# Patient Record
Sex: Female | Born: 1995 | Race: Black or African American | Hispanic: No | Marital: Single | Smoking: Former smoker
Health system: Southern US, Community
[De-identification: ages and names within clinical notes are randomized; demographics above are authoritative.]

## PROBLEM LIST (undated history)

## (undated) ENCOUNTER — Inpatient Hospital Stay (HOSPITAL_COMMUNITY): Payer: Self-pay

## (undated) DIAGNOSIS — F419 Anxiety disorder, unspecified: Secondary | ICD-10-CM

## (undated) DIAGNOSIS — I10 Essential (primary) hypertension: Secondary | ICD-10-CM

## (undated) DIAGNOSIS — D649 Anemia, unspecified: Secondary | ICD-10-CM

## (undated) HISTORY — PX: TONSILLECTOMY: SUR1361

## (undated) HISTORY — DX: Essential (primary) hypertension: I10

## (undated) HISTORY — PX: WISDOM TOOTH EXTRACTION: SHX21

## (undated) HISTORY — DX: Anemia, unspecified: D64.9

## (undated) HISTORY — DX: Anxiety disorder, unspecified: F41.9

---

## 2017-02-19 DIAGNOSIS — O09899 Supervision of other high risk pregnancies, unspecified trimester: Secondary | ICD-10-CM | POA: Insufficient documentation

## 2017-02-19 HISTORY — DX: Supervision of other high risk pregnancies, unspecified trimester: O09.899

## 2017-04-18 DIAGNOSIS — O139 Gestational [pregnancy-induced] hypertension without significant proteinuria, unspecified trimester: Secondary | ICD-10-CM | POA: Insufficient documentation

## 2017-05-24 DIAGNOSIS — O139 Gestational [pregnancy-induced] hypertension without significant proteinuria, unspecified trimester: Secondary | ICD-10-CM

## 2018-03-21 ENCOUNTER — Ambulatory Visit (INDEPENDENT_AMBULATORY_CARE_PROVIDER_SITE_OTHER): Payer: Medicaid Other | Admitting: *Deleted

## 2018-03-21 ENCOUNTER — Other Ambulatory Visit (HOSPITAL_COMMUNITY)
Admission: RE | Admit: 2018-03-21 | Discharge: 2018-03-21 | Disposition: A | Discharge status: Home / Self Care | Payer: Medicaid Other | Source: Ambulatory Visit | Attending: Family Medicine | Admitting: Family Medicine

## 2018-03-21 VITALS — BP 125/79 | HR 102 | Ht 65.0 in | Wt 295.9 lb

## 2018-03-21 DIAGNOSIS — O0991 Supervision of high risk pregnancy, unspecified, first trimester: Secondary | ICD-10-CM | POA: Diagnosis not present

## 2018-03-21 DIAGNOSIS — Z8759 Personal history of other complications of pregnancy, childbirth and the puerperium: Secondary | ICD-10-CM | POA: Insufficient documentation

## 2018-03-21 DIAGNOSIS — Z98891 History of uterine scar from previous surgery: Secondary | ICD-10-CM

## 2018-03-21 DIAGNOSIS — O0990 Supervision of high risk pregnancy, unspecified, unspecified trimester: Secondary | ICD-10-CM | POA: Diagnosis not present

## 2018-03-21 HISTORY — DX: Supervision of high risk pregnancy, unspecified, unspecified trimester: O09.90

## 2018-03-21 HISTORY — DX: History of uterine scar from previous surgery: Z98.891

## 2018-03-21 HISTORY — DX: Personal history of other complications of pregnancy, childbirth and the puerperium: Z87.59

## 2018-03-21 NOTE — Progress Notes (Addendum)
New Ob intake interview completed. New Ob information packet given. Pt reports no problems. She was unable to meet w/Jamie for intro today. Pt will sign ROI today in order to obtain previous pregnancy records.

## 2018-03-23 LAB — CULTURE, OB URINE

## 2018-03-23 LAB — URINE CULTURE, OB REFLEX

## 2018-03-24 LAB — AB SCR+ANTIBODY ID: Antibody Screen: POSITIVE — AB

## 2018-03-24 LAB — OBSTETRIC PANEL, INCLUDING HIV
Basophils Absolute: 0 10*3/uL (ref 0.0–0.2)
Basos: 0 %
EOS (ABSOLUTE): 0.1 10*3/uL (ref 0.0–0.4)
Eos: 1 %
HIV Screen 4th Generation wRfx: NONREACTIVE
Hematocrit: 32.9 % — ABNORMAL LOW (ref 34.0–46.6)
Hemoglobin: 10.6 g/dL — ABNORMAL LOW (ref 11.1–15.9)
Hepatitis B Surface Ag: NEGATIVE
Immature Grans (Abs): 0 10*3/uL (ref 0.0–0.1)
Immature Granulocytes: 0 %
Lymphocytes Absolute: 2.2 10*3/uL (ref 0.7–3.1)
Lymphs: 26 %
MCH: 24.8 pg — ABNORMAL LOW (ref 26.6–33.0)
MCHC: 32.2 g/dL (ref 31.5–35.7)
MCV: 77 fL — ABNORMAL LOW (ref 79–97)
Monocytes Absolute: 0.5 10*3/uL (ref 0.1–0.9)
Monocytes: 6 %
Neutrophils Absolute: 5.8 10*3/uL (ref 1.4–7.0)
Neutrophils: 67 %
Platelets: 304 10*3/uL (ref 150–450)
RBC: 4.27 x10E6/uL (ref 3.77–5.28)
RDW: 17.4 % — ABNORMAL HIGH (ref 12.3–15.4)
RPR Ser Ql: NONREACTIVE
Rh Factor: POSITIVE
Rubella Antibodies, IGG: 5.09 {index}
WBC: 8.7 10*3/uL (ref 3.4–10.8)

## 2018-03-24 LAB — GC/CHLAMYDIA PROBE AMP (~~LOC~~) NOT AT ARMC
Chlamydia: NEGATIVE
Neisseria Gonorrhea: NEGATIVE

## 2018-03-27 ENCOUNTER — Encounter: Payer: Self-pay | Admitting: Obstetrics and Gynecology

## 2018-03-27 DIAGNOSIS — O361990 Maternal care for other isoimmunization, unspecified trimester, not applicable or unspecified: Secondary | ICD-10-CM

## 2018-03-27 HISTORY — DX: Maternal care for other isoimmunization, unspecified trimester, not applicable or unspecified: O36.1990

## 2018-03-31 ENCOUNTER — Encounter: Payer: Self-pay | Admitting: *Deleted

## 2018-03-31 ENCOUNTER — Encounter (HOSPITAL_COMMUNITY): Payer: Self-pay

## 2018-04-03 ENCOUNTER — Ambulatory Visit: Payer: Medicaid Other | Admitting: Clinical

## 2018-04-03 ENCOUNTER — Ambulatory Visit (INDEPENDENT_AMBULATORY_CARE_PROVIDER_SITE_OTHER): Payer: Medicaid Other | Admitting: Obstetrics and Gynecology

## 2018-04-03 VITALS — BP 104/69 | HR 86 | Wt 294.2 lb

## 2018-04-03 DIAGNOSIS — O0990 Supervision of high risk pregnancy, unspecified, unspecified trimester: Secondary | ICD-10-CM

## 2018-04-03 DIAGNOSIS — Z98891 History of uterine scar from previous surgery: Secondary | ICD-10-CM

## 2018-04-03 DIAGNOSIS — O99212 Obesity complicating pregnancy, second trimester: Secondary | ICD-10-CM

## 2018-04-03 DIAGNOSIS — O99210 Obesity complicating pregnancy, unspecified trimester: Secondary | ICD-10-CM

## 2018-04-03 DIAGNOSIS — O0992 Supervision of high risk pregnancy, unspecified, second trimester: Secondary | ICD-10-CM

## 2018-04-03 DIAGNOSIS — Z8759 Personal history of other complications of pregnancy, childbirth and the puerperium: Secondary | ICD-10-CM

## 2018-04-03 HISTORY — DX: Obesity complicating pregnancy, unspecified trimester: O99.210

## 2018-04-03 NOTE — Progress Notes (Signed)
Subjective:   Catherine Cochran is a 23 y.o. G3P2002 at [redacted]w[redacted]d by LMP being seen today for her first obstetrical visit.  Her obstetrical history is significant for obesity and pregnancy induced hypertension with previous pregnancy, C section X 2. Patient does intend to breast feed. Pregnancy history fully reviewed. Planned pregnancy, excited about the pregnancy.   Patient reports no complaints.  HISTORY: OB History  Gravida Para Term Preterm AB Living  3 2 2  0 0 2  SAB TAB Ectopic Multiple Live Births  0 0 0 0 2    # Outcome Date GA Lbr Len/2nd Weight Sex Delivery Anes PTL Lv  3 Current           2 Term 05/24/17   5 lb 13 oz (2.637 kg) F CS-LTranv EPI  LIV     Birth Comments: 2 vessel cord     Complications: Gestational hypertension     Name: Maryelizabeth Kaufmann  1 Term 02/28/16   6 lb 10 oz (3.005 kg) M CS-LTranv EPI N LIV     Complications: Fetal Intolerance     Name: Bhavini Pirrello    Last pap smear was not done, last pap was 03/2018 and it was normal per the patient.   Past Medical History:  Diagnosis Date  . Anemia   . Hypertension    Gestational HTN 2018   Past Surgical History:  Procedure Laterality Date  . CESAREAN SECTION  2017, 2018  . TONSILLECTOMY     2nd grade  . WISDOM TOOTH EXTRACTION     Family History  Problem Relation Age of Onset  . Diabetes Mother   . HIV Mother   . Asthma Mother   . Asthma Brother    Social History   Tobacco Use  . Smoking status: Never Smoker  . Smokeless tobacco: Never Used  Substance Use Topics  . Alcohol use: Not Currently  . Drug use: Never   No Known Allergies Current Outpatient Medications on File Prior to Visit  Medication Sig Dispense Refill  . Prenatal Vit-Fe Fumarate-FA (MULTIVITAMIN-PRENATAL) 27-0.8 MG TABS tablet Take 1 tablet by mouth daily at 12 noon.     No current facility-administered medications on file prior to visit.     Review of Systems Pertinent items noted in HPI and remainder of comprehensive ROS  otherwise negative.  Exam   Vitals:   04/03/18 1509  BP: 104/69  Pulse: 86  Weight: 294 lb 3.2 oz (133.4 kg)   Fetal Heart Rate (bpm): 156  Uterus:     Pelvic Exam: Perineum: no hemorrhoids, normal perineum   Vulva: normal external genitalia, no lesions   Vagina:  normal mucosa, normal discharge   Cervix: no lesions and normal, pap smear done.    Adnexa: normal adnexa and no mass, fullness, tenderness   Bony Pelvis: average  System: General: well-developed, well-nourished female in no acute distress   Breasts:  normal appearance, no masses or tenderness bilaterally   Skin: normal coloration and turgor, no rashes   Neurologic: oriented, normal, negative, normal mood   Extremities: normal strength, tone, and muscle mass, ROM of all joints is normal   HEENT PERRLA, extraocular movement intact and sclera clear, anicteric   Mouth/Teeth mucous membranes moist, pharynx normal without lesions and dental hygiene good   Neck supple and no masses   Cardiovascular: regular rate and rhythm   Respiratory:  no respiratory distress, normal breath sounds   Abdomen: soft, non-tender; bowel sounds normal; no masses,  no organomegaly   Bedside Ultrasound for FHR check: Patient informed that the ultrasound is considered a limited obstetric ultrasound and is not intended to be a complete ultrasound exam.  Patient also informed that the ultrasound is not being completed with the intent of assessing for fetal or placental anomalies or any pelvic abnormalities.  Explained that the purpose of today's ultrasound is to assess for fetal heart rate.  Patient acknowledges the purpose of the exam and the limitations of the study.   Assessment:   Pregnancy: [id number] Patient Active Problem List   Diagnosis Date Noted  . 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 Supervision of high risk pregnancy, antepartum 03/21/2018  . History of gestational hypertension 03/21/2018  . History of 2 cesarean  sections 03/21/2018     Plan:   1. Supervision of high risk pregnancy, antepartum  - AFP/Quad Scr - Hemoglobinopathy evaluation - HgB A1c - Inheritest(R) CF/SMA Panel  2. History of gestational hypertension  Start BASA   3. History of 2 cesarean sections  Repeat desired    Initial labs drawn. Continue prenatal vitamins. Genetic Screening discussed, NIPS: results reviewed. Ultrasound discussed; fetal anatomic survey: ordered. Problem list reviewed and updated. The nature of [facility] with multiple MDs and other Advanced Practice Providers was explained to patient; also emphasized that residents, students are part of our team. Routine obstetric precautions reviewed. No follow-ups on file.    Biochemist, clinical for [employer], [facility]

## 2018-04-03 NOTE — BH Specialist Note (Signed)
Integrated Behavioral Health Initial Visit  MRN: [id number] Name: Catherine Cochran  Number of Integrated Behavioral Health Clinician visits:: 1/6 Session Start time: 3:52  Session End time: 4:04 Total time: 15 minutes  Type of Service: Integrated Behavioral Health- Individual/Family Interpretor:No. Interpretor Name and Language: n/a   Warm Hand Off Completed.       SUBJECTIVE: Catherine Cochran is a 23 y.o. female accompanied by Partner/Significant Other Patient was referred by Tinnie Gens, MD for Initial OB introduction to integrated behavioral health services . Patient reports the following symptoms/concerns: Pt has no particular concerns today. Duration of problem: n/a; Severity of problem: n/a  OBJECTIVE: Mood: Normal and Affect: Appropriate Risk of harm to self or others: No plan to harm self or others  LIFE CONTEXT: Family and Social: Pt lives with FOB and two children(3;1) School/Work: - Self-Care: - Life Changes: Current pregnancy   GOALS ADDRESSED: Patient will: 1. Increase knowledge and/or ability of: healthy habits   INTERVENTIONS: Interventions utilized: Psychoeducation and/or Health Education  Standardized Assessments completed: Not given today  ASSESSMENT: Patient currently experiencing Supervision of high risk pregnancy, antepartum.   Patient may benefit from Initial OB introduction to integrated behavioral health services .  PLAN: 1. Follow up with behavioral health clinician on : As needed 2. Behavioral recommendations:  -Continue taking prenatal vitamins, as recommended by medical provider  3. Referral(s): Integrated [employer] (In Clinic) 4. "From scale of 1-10, how likely are you to follow plan?": 10  Rae Lips, LCSW   Depression screen [facility] 2/9 03/21/2018  Decreased Interest 0  Down, Depressed, Hopeless 0  PHQ - 2 Score 0  Altered sleeping 1  Tired, decreased energy 1  Change in appetite 1  Feeling bad or failure about  yourself  0  Trouble concentrating 0  Moving slowly or fidgety/restless 0  Suicidal thoughts 0  PHQ-9 Score 3   GAD 7 : Generalized Anxiety Score 03/21/2018  Nervous, Anxious, on Edge 2  Control/stop worrying 0  Worry too much - different things 1  Trouble relaxing 1  Restless 0  Easily annoyed or irritable 1  Afraid - awful might happen 1  Total GAD 7 Score 6

## 2018-04-07 ENCOUNTER — Other Ambulatory Visit (HOSPITAL_COMMUNITY): Payer: Self-pay | Admitting: *Deleted

## 2018-04-07 ENCOUNTER — Encounter (HOSPITAL_COMMUNITY): Payer: Self-pay

## 2018-04-07 ENCOUNTER — Ambulatory Visit (HOSPITAL_COMMUNITY)
Admission: RE | Admit: 2018-04-07 | Discharge: 2018-04-07 | Disposition: A | Discharge status: Home / Self Care | Payer: Medicaid Other | Source: Ambulatory Visit | Attending: Obstetrics and Gynecology | Admitting: Obstetrics and Gynecology

## 2018-04-07 DIAGNOSIS — O99212 Obesity complicating pregnancy, second trimester: Secondary | ICD-10-CM

## 2018-04-07 DIAGNOSIS — O09292 Supervision of pregnancy with other poor reproductive or obstetric history, second trimester: Secondary | ICD-10-CM | POA: Diagnosis not present

## 2018-04-07 DIAGNOSIS — Z363 Encounter for antenatal screening for malformations: Secondary | ICD-10-CM | POA: Diagnosis not present

## 2018-04-07 DIAGNOSIS — O0990 Supervision of high risk pregnancy, unspecified, unspecified trimester: Secondary | ICD-10-CM

## 2018-04-07 DIAGNOSIS — Z3A19 19 weeks gestation of pregnancy: Secondary | ICD-10-CM

## 2018-04-07 DIAGNOSIS — O34219 Maternal care for unspecified type scar from previous cesarean delivery: Secondary | ICD-10-CM

## 2018-04-16 LAB — HEMOGLOBINOPATHY EVALUATION
HGB C: 0 %
HGB S: 0 %
HGB VARIANT: 0 %
Hemoglobin A2 Quantitation: 2.2 % (ref 1.8–3.2)
Hemoglobin F Quantitation: 0 % (ref 0.0–2.0)
Hgb A: 97.8 % (ref 96.4–98.8)

## 2018-04-16 LAB — INHERITEST(R) CF/SMA PANEL

## 2018-04-16 LAB — AFP TUMOR MARKER: AFP, Serum, Tumor Marker: 31.1 ng/mL — ABNORMAL HIGH (ref 0.0–8.3)

## 2018-04-16 LAB — HEMOGLOBIN A1C
Est. average glucose Bld gHb Est-mCnc: 114 mg/dL
Hgb A1c MFr Bld: 5.6 % (ref 4.8–5.6)

## 2018-05-01 ENCOUNTER — Ambulatory Visit (INDEPENDENT_AMBULATORY_CARE_PROVIDER_SITE_OTHER): Payer: Medicaid Other | Admitting: Obstetrics & Gynecology

## 2018-05-01 VITALS — BP 123/68 | HR 77 | Wt 292.5 lb

## 2018-05-01 DIAGNOSIS — O0990 Supervision of high risk pregnancy, unspecified, unspecified trimester: Secondary | ICD-10-CM

## 2018-05-01 DIAGNOSIS — O0992 Supervision of high risk pregnancy, unspecified, second trimester: Secondary | ICD-10-CM

## 2018-05-01 DIAGNOSIS — Z3A22 22 weeks gestation of pregnancy: Secondary | ICD-10-CM

## 2018-05-01 NOTE — Progress Notes (Signed)
   PRENATAL VISIT NOTE  Subjective:  Catherine Cochran is a 23 y.o. G3P2002 at 9875w4d being seen today for ongoing prenatal care.  She is currently monitored for the following issues for this high-risk pregnancy and has Supervision of high risk pregnancy, antepartum; History of gestational hypertension; History of 2 cesarean sections; Lewis isoimmunization during pregnancy; and Obesity in pregnancy on their problem list.  Patient reports rash RUQ for one week resolving now.  Contractions: Not present. Vag. Bleeding: None.  Movement: Present. Denies leaking of fluid.   The following portions of the patient's history were reviewed and updated as appropriate: allergies, current medications, past family history, past medical history, past social history, past surgical history and problem list. Problem list updated.  Objective:   Vitals:   05/01/18 0944  BP: 123/68  Pulse: 77  Weight: 292 lb 8 oz (132.7 kg)    Fetal Status: Fetal Heart Rate (bpm): 145   Movement: Present     General:  Alert, oriented and cooperative. Patient is in no acute distress.  Skin: Skin is warm and dry. No rash noted.   Cardiovascular: Normal heart rate noted  Respiratory: Normal respiratory effort, no problems with respiration noted  Abdomen: Soft, gravid, appropriate for gestational age.  Pain/Pressure: Absent     Pelvic: Cervical exam deferred        Extremities: Normal range of motion.  Edema: None  Mental Status: Normal mood and affect. Normal behavior. Normal judgment and thought content.  Transverse band of rash RUQ c/w resolving shingles Assessment and Plan:  Pregnancy: G3P2002 at 5075w4d  1. Supervision of high risk pregnancy, antepartum Repeat AFP, lab was run in error - AFP, Serum, Open Spina Bifida  Preterm labor symptoms and general obstetric precautions including but not limited to vaginal bleeding, contractions, leaking of fluid and fetal movement were reviewed in detail with the patient. Please refer  to After Visit Summary for other counseling recommendations.  Return in about 4 weeks (around 05/29/2018) for 2 hr GTT.  Future Appointments  Date Time Provider Department Center  05/05/2018 11:15 AM WH-MFC US 4 WH-MFCUS MFC-US    Scheryl DarterJames , MD

## 2018-05-01 NOTE — Patient Instructions (Signed)

## 2018-05-03 LAB — AFP, SERUM, OPEN SPINA BIFIDA
AFP MoM: 0.73
AFP Value: 42.5 ng/mL
GEST. AGE ON COLLECTION DATE: 22.4 wk
Maternal Age At EDD: 23.8 yr
OSBR Risk 1 IN: 10000
TEST RESULTS AFP: NEGATIVE
Weight: 292 [lb_av]

## 2018-05-05 ENCOUNTER — Ambulatory Visit (HOSPITAL_COMMUNITY)
Admission: RE | Admit: 2018-05-05 | Discharge: 2018-05-05 | Disposition: A | Discharge status: Home / Self Care | Payer: Medicaid Other | Source: Ambulatory Visit | Attending: Obstetrics and Gynecology | Admitting: Obstetrics and Gynecology

## 2018-05-05 ENCOUNTER — Encounter (HOSPITAL_COMMUNITY): Payer: Self-pay

## 2018-05-05 ENCOUNTER — Other Ambulatory Visit (HOSPITAL_COMMUNITY): Payer: Self-pay | Admitting: *Deleted

## 2018-05-05 DIAGNOSIS — O34219 Maternal care for unspecified type scar from previous cesarean delivery: Secondary | ICD-10-CM | POA: Diagnosis not present

## 2018-05-05 DIAGNOSIS — O09292 Supervision of pregnancy with other poor reproductive or obstetric history, second trimester: Secondary | ICD-10-CM

## 2018-05-05 DIAGNOSIS — O09892 Supervision of other high risk pregnancies, second trimester: Secondary | ICD-10-CM | POA: Diagnosis not present

## 2018-05-05 DIAGNOSIS — O99212 Obesity complicating pregnancy, second trimester: Secondary | ICD-10-CM | POA: Diagnosis not present

## 2018-05-05 DIAGNOSIS — Z3A23 23 weeks gestation of pregnancy: Secondary | ICD-10-CM

## 2018-05-27 ENCOUNTER — Other Ambulatory Visit: Payer: Self-pay | Admitting: *Deleted

## 2018-05-27 DIAGNOSIS — O0990 Supervision of high risk pregnancy, unspecified, unspecified trimester: Secondary | ICD-10-CM

## 2018-05-29 ENCOUNTER — Ambulatory Visit (INDEPENDENT_AMBULATORY_CARE_PROVIDER_SITE_OTHER): Payer: Medicaid Other | Admitting: Obstetrics and Gynecology

## 2018-05-29 ENCOUNTER — Telehealth: Payer: Self-pay | Admitting: Obstetrics and Gynecology

## 2018-05-29 ENCOUNTER — Other Ambulatory Visit: Payer: Self-pay

## 2018-05-29 ENCOUNTER — Other Ambulatory Visit: Payer: Medicaid Other

## 2018-05-29 DIAGNOSIS — Z98891 History of uterine scar from previous surgery: Secondary | ICD-10-CM

## 2018-05-29 DIAGNOSIS — Z3A26 26 weeks gestation of pregnancy: Secondary | ICD-10-CM

## 2018-05-29 DIAGNOSIS — O0990 Supervision of high risk pregnancy, unspecified, unspecified trimester: Secondary | ICD-10-CM

## 2018-05-29 DIAGNOSIS — Z8759 Personal history of other complications of pregnancy, childbirth and the puerperium: Secondary | ICD-10-CM

## 2018-05-29 DIAGNOSIS — O361920 Maternal care for other isoimmunization, second trimester, not applicable or unspecified: Secondary | ICD-10-CM

## 2018-05-29 DIAGNOSIS — O0992 Supervision of high risk pregnancy, unspecified, second trimester: Secondary | ICD-10-CM

## 2018-05-29 NOTE — Telephone Encounter (Signed)
Attempted to call patient to get her scheduled for ROB. No answer, left voicemail to give the office a call back to get scheduled.

## 2018-05-30 LAB — RPR: RPR Ser Ql: NONREACTIVE

## 2018-05-30 LAB — HIV ANTIBODY (ROUTINE TESTING W REFLEX): HIV Screen 4th Generation wRfx: NONREACTIVE

## 2018-05-30 LAB — CBC
HEMATOCRIT: 31.6 % — AB (ref 34.0–46.6)
HEMOGLOBIN: 9.8 g/dL — AB (ref 11.1–15.9)
MCH: 25.6 pg — ABNORMAL LOW (ref 26.6–33.0)
MCHC: 31 g/dL — ABNORMAL LOW (ref 31.5–35.7)
MCV: 83 fL (ref 79–97)
Platelets: 352 10*3/uL (ref 150–450)
RBC: 3.83 x10E6/uL (ref 3.77–5.28)
RDW: 16.1 % — ABNORMAL HIGH (ref 11.7–15.4)
WBC: 11.9 10*3/uL — ABNORMAL HIGH (ref 3.4–10.8)

## 2018-05-30 LAB — GLUCOSE TOLERANCE, 2 HOURS W/ 1HR
Glucose, 1 hour: 88 mg/dL (ref 65–179)
Glucose, 2 hour: 86 mg/dL (ref 65–152)
Glucose, Fasting: 82 mg/dL (ref 65–91)

## 2018-05-30 NOTE — Progress Notes (Signed)
Prenatal Visit Note Date: 05/29/2018 Clinic: [facility]  Subjective:  Catherine Cochran is a 23 y.o. G3P2002 at [redacted]w[redacted]d being seen today for ongoing prenatal care.  She is currently monitored for the following issues for this high-risk pregnancy and has Supervision of high risk pregnancy, antepartum; History of gestational hypertension; History of 2 cesarean sections; Lewis isoimmunization during pregnancy; and Obesity in pregnancy on their problem list.  Patient reports no complaints.   Contractions: Not present. Vag. Bleeding: None.  Movement: Present. Denies leaking of fluid.   The following portions of the patient's history were reviewed and updated as appropriate: allergies, current medications, past family history, past medical history, past social history, past surgical history and problem list. Problem list updated.  Objective:  There were no vitals filed for this visit.  Fetal Status:   Fundal Height: 26 cm Movement: Present     General:  Alert, oriented and cooperative. Patient is in no acute distress.  Skin: Skin is warm and dry. No rash noted.   Cardiovascular: Normal heart rate noted  Respiratory: Normal respiratory effort, no problems with respiration noted  Abdomen: Soft, gravid, appropriate for gestational age. Pain/Pressure: Absent     Pelvic:  Cervical exam deferred        Extremities: Normal range of motion.     Mental Status: Normal mood and affect. Normal behavior. Normal judgment and thought content.   Urinalysis:      Assessment and Plan:  Pregnancy: G3P2002 at [redacted]w[redacted]d  1. Supervision of high risk pregnancy, antepartum 28wk labs today. D/w her again re: BC. ?LARC  2. History of gestational hypertension Confirms low dose asa  3. History of 2 cesarean sections D/w her re: delivery mode nv  4. Lewis isoimmunization during pregnancy in second trimester, single or unspecified fetus No issues, no need for further surveillance  Preterm labor  symptoms and general obstetric precautions including but not limited to vaginal bleeding, contractions, leaking of fluid and fetal movement were reviewed in detail with the patient. Please refer to After Visit Summary for other counseling recommendations.  Return in about 2 weeks (around 06/12/2018).   [place] Bing, MD

## 2018-06-02 ENCOUNTER — Encounter: Payer: Self-pay | Admitting: Obstetrics and Gynecology

## 2018-06-02 DIAGNOSIS — O99019 Anemia complicating pregnancy, unspecified trimester: Secondary | ICD-10-CM | POA: Insufficient documentation

## 2018-06-02 HISTORY — DX: Anemia complicating pregnancy, unspecified trimester: O99.019

## 2018-06-02 MED ORDER — DOCUSATE SODIUM 100 MG PO CAPS: 100.0000 mg | ORAL_CAPSULE | Freq: Two times a day (BID) | ORAL | 3 refills | Status: DC | PRN

## 2018-06-02 MED ORDER — FERROUS GLUCONATE 324 (38 FE) MG PO TABS: 324.0000 mg | ORAL_TABLET | Freq: Two times a day (BID) | ORAL | 2 refills | Status: DC

## 2018-06-03 ENCOUNTER — Ambulatory Visit (HOSPITAL_COMMUNITY)
Admission: RE | Admit: 2018-06-03 | Discharge: 2018-06-03 | Disposition: A | Discharge status: Home / Self Care | Payer: Medicaid Other | Source: Ambulatory Visit | Attending: Obstetrics and Gynecology | Admitting: Obstetrics and Gynecology

## 2018-06-03 ENCOUNTER — Other Ambulatory Visit (HOSPITAL_COMMUNITY): Payer: Self-pay | Admitting: *Deleted

## 2018-06-03 ENCOUNTER — Other Ambulatory Visit: Payer: Self-pay

## 2018-06-03 ENCOUNTER — Ambulatory Visit (HOSPITAL_COMMUNITY): Payer: Medicaid Other | Admitting: *Deleted

## 2018-06-03 ENCOUNTER — Encounter (HOSPITAL_COMMUNITY): Payer: Self-pay

## 2018-06-03 VITALS — BP 103/75 | HR 103 | Temp 98.9°F | Wt 289.6 lb

## 2018-06-03 DIAGNOSIS — O09892 Supervision of other high risk pregnancies, second trimester: Secondary | ICD-10-CM | POA: Diagnosis not present

## 2018-06-03 DIAGNOSIS — O99212 Obesity complicating pregnancy, second trimester: Secondary | ICD-10-CM

## 2018-06-03 DIAGNOSIS — Z362 Encounter for other antenatal screening follow-up: Secondary | ICD-10-CM | POA: Diagnosis not present

## 2018-06-03 DIAGNOSIS — O34219 Maternal care for unspecified type scar from previous cesarean delivery: Secondary | ICD-10-CM | POA: Diagnosis not present

## 2018-06-03 DIAGNOSIS — O99213 Obesity complicating pregnancy, third trimester: Secondary | ICD-10-CM

## 2018-06-03 DIAGNOSIS — O0990 Supervision of high risk pregnancy, unspecified, unspecified trimester: Secondary | ICD-10-CM

## 2018-06-03 DIAGNOSIS — Z3A27 27 weeks gestation of pregnancy: Secondary | ICD-10-CM

## 2018-06-03 DIAGNOSIS — O09292 Supervision of pregnancy with other poor reproductive or obstetric history, second trimester: Secondary | ICD-10-CM

## 2018-06-09 ENCOUNTER — Encounter: Payer: Self-pay | Admitting: *Deleted

## 2018-06-10 ENCOUNTER — Encounter: Payer: Self-pay | Admitting: *Deleted

## 2018-06-11 ENCOUNTER — Telehealth: Payer: Self-pay | Admitting: Obstetrics and Gynecology

## 2018-06-11 NOTE — Telephone Encounter (Signed)
Called the patient with information about an appointment and left a detailed message on the voicemail.

## 2018-06-12 ENCOUNTER — Other Ambulatory Visit: Payer: Self-pay

## 2018-06-12 ENCOUNTER — Telehealth: Payer: Self-pay | Admitting: Obstetrics and Gynecology

## 2018-06-12 ENCOUNTER — Encounter: Payer: Self-pay | Admitting: Obstetrics and Gynecology

## 2018-06-12 ENCOUNTER — Ambulatory Visit (INDEPENDENT_AMBULATORY_CARE_PROVIDER_SITE_OTHER): Payer: Medicaid Other | Admitting: *Deleted

## 2018-06-12 ENCOUNTER — Ambulatory Visit (INDEPENDENT_AMBULATORY_CARE_PROVIDER_SITE_OTHER): Payer: Medicaid Other | Admitting: Obstetrics and Gynecology

## 2018-06-12 VITALS — Temp 99.4°F

## 2018-06-12 DIAGNOSIS — Z8759 Personal history of other complications of pregnancy, childbirth and the puerperium: Secondary | ICD-10-CM

## 2018-06-12 DIAGNOSIS — O09293 Supervision of pregnancy with other poor reproductive or obstetric history, third trimester: Secondary | ICD-10-CM

## 2018-06-12 DIAGNOSIS — O0990 Supervision of high risk pregnancy, unspecified, unspecified trimester: Secondary | ICD-10-CM

## 2018-06-12 DIAGNOSIS — Z3A28 28 weeks gestation of pregnancy: Secondary | ICD-10-CM

## 2018-06-12 DIAGNOSIS — O361930 Maternal care for other isoimmunization, third trimester, not applicable or unspecified: Secondary | ICD-10-CM

## 2018-06-12 DIAGNOSIS — O0993 Supervision of high risk pregnancy, unspecified, third trimester: Secondary | ICD-10-CM | POA: Diagnosis not present

## 2018-06-12 DIAGNOSIS — Z98891 History of uterine scar from previous surgery: Secondary | ICD-10-CM

## 2018-06-12 DIAGNOSIS — O34211 Maternal care for low transverse scar from previous cesarean delivery: Secondary | ICD-10-CM

## 2018-06-12 DIAGNOSIS — O361920 Maternal care for other isoimmunization, second trimester, not applicable or unspecified: Secondary | ICD-10-CM

## 2018-06-12 NOTE — Telephone Encounter (Signed)
Called the patient and left a voicemail.

## 2018-06-12 NOTE — Progress Notes (Signed)
   TELEHEALTH VIRTUAL OBSTETRICS VISIT ENCOUNTER NOTE  I connected with Catherine Cochran on 06/12/18 at  9:35 AM EDT by telephone at home and verified that I am speaking with the correct person using two identifiers.   I discussed the limitations, risks, security and privacy concerns of performing an evaluation and management service by telephone and the availability of in person appointments. I also discussed with the patient that there may be a patient responsible charge related to this service. The patient expressed understanding and agreed to proceed.  Subjective:  Catherine Cochran is a 23 y.o. G3P2002 at [redacted]w[redacted]d being followed for ongoing prenatal care.  She is currently monitored for the following issues for this high-risk pregnancy and has Supervision of high risk pregnancy, antepartum; History of gestational hypertension; History of 2 cesarean sections; Lewis isoimmunization during pregnancy; Obesity in pregnancy; and Anemia in pregnancy on their problem list.  Patient reports no complaints. Reports fetal movement. Denies any contractions, bleeding or leaking of fluid.   The following portions of the patient's history were reviewed and updated as appropriate: allergies, current medications, past family history, past medical history, past social history, past surgical history and problem list.   Objective:   General:  Alert, oriented and cooperative.   Mental Status: Normal mood and affect perceived. Normal judgment and thought content.  Rest of physical exam deferred due to type of encounter  Assessment and Plan:  Pregnancy: G3P2002 at [redacted]w[redacted]d  1. Supervision of high risk pregnancy, antepartum IUD at time of delivery if possible Taking iron BID  2. History of gestational hypertension Enrolled in babyscripts, will get BP cuff sent to home and start checking Has growth [place] scheduled for 4/14  3. History of 2 cesarean sections RCS scheduled for 39 weeks  4. Lewis isoimmunization during  pregnancy in second trimester, single or unspecified fetus No titer indicated   Preterm labor symptoms and general obstetric precautions including but not limited to vaginal bleeding, contractions, leaking of fluid and fetal movement were reviewed in detail with the patient.  I discussed the assessment and treatment plan with the patient. The patient was provided an opportunity to ask questions and all were answered. The patient agreed with the plan and demonstrated an understanding of the instructions. The patient was advised to call back or seek an in-person office evaluation/go to MAU at [facility] for any urgent or concerning symptoms. Please refer to After Visit Summary for other counseling recommendations.   Return in about 2 weeks (around 06/26/2018).  Future Appointments  Date Time Provider Department Center  07/01/2018 11:00 AM WH-M[facility] [place] 3 WH-MFCUS MFC-US    Conan Bowens, MD Center for [facility], [facility]

## 2018-06-12 NOTE — Progress Notes (Signed)
Here for tdap but had temperature of 99.4.discussed with Dr. Earlene Plater and advised to not give tdap today. Make appt for one week .  Patient voices understanding.  Linda,RN

## 2018-06-18 ENCOUNTER — Telehealth: Payer: Self-pay | Admitting: *Deleted

## 2018-06-18 NOTE — Telephone Encounter (Signed)
No blood pressures noted in babyscripts. Called patient to discuss which cuff she has and to sync it.  No answer. Left message to please bring your blood pressure cuff to office tomorrow for your appointment so we can assist you

## 2018-06-19 ENCOUNTER — Other Ambulatory Visit: Payer: Self-pay

## 2018-06-19 ENCOUNTER — Ambulatory Visit (INDEPENDENT_AMBULATORY_CARE_PROVIDER_SITE_OTHER): Payer: Medicaid Other | Admitting: *Deleted

## 2018-06-19 VITALS — Temp 98.7°F

## 2018-06-19 DIAGNOSIS — Z23 Encounter for immunization: Secondary | ICD-10-CM

## 2018-06-19 DIAGNOSIS — O0990 Supervision of high risk pregnancy, unspecified, unspecified trimester: Secondary | ICD-10-CM

## 2018-06-19 DIAGNOSIS — Z8759 Personal history of other complications of pregnancy, childbirth and the puerperium: Secondary | ICD-10-CM

## 2018-06-19 NOTE — Progress Notes (Signed)
I have reviewed this chart and agree with the RN/CMA assessment and management.    Shanteria Laye C Leopold Smyers, MD, FACOG Attending Physician, Faculty Practice [facility]  

## 2018-06-19 NOTE — Progress Notes (Signed)
Catherine Cochran here for Tdap  Injection.  Injection administered without complication. Patient will return in 06/30/18 for next ob visit. Also discussed she has been able to sync her blood pressure cuff and enter into babyscripts.   [name],RN 06/19/2018  10:50 AM

## 2018-06-26 ENCOUNTER — Telehealth: Payer: Self-pay | Admitting: Obstetrics and Gynecology

## 2018-06-26 NOTE — Telephone Encounter (Signed)
Left a detailed voicemail regarding the webex app for the virtual appointment on Monday.

## 2018-06-30 ENCOUNTER — Encounter: Payer: Medicaid Other | Admitting: Obstetrics and Gynecology

## 2018-06-30 ENCOUNTER — Telehealth: Payer: Self-pay | Admitting: Family Medicine

## 2018-06-30 ENCOUNTER — Telehealth: Payer: Self-pay | Admitting: Obstetrics & Gynecology

## 2018-06-30 NOTE — Telephone Encounter (Signed)
Called the patient to inform of the rescheduled appointment. Left a detailed message and also sent the patient a message via mychart.

## 2018-07-01 ENCOUNTER — Other Ambulatory Visit: Payer: Self-pay

## 2018-07-01 ENCOUNTER — Other Ambulatory Visit (HOSPITAL_COMMUNITY): Payer: Self-pay | Admitting: *Deleted

## 2018-07-01 ENCOUNTER — Ambulatory Visit (HOSPITAL_COMMUNITY)
Admission: RE | Admit: 2018-07-01 | Discharge: 2018-07-01 | Disposition: A | Discharge status: Home / Self Care | Payer: Medicaid Other | Source: Ambulatory Visit | Attending: Obstetrics and Gynecology | Admitting: Obstetrics and Gynecology

## 2018-07-01 DIAGNOSIS — O09293 Supervision of pregnancy with other poor reproductive or obstetric history, third trimester: Secondary | ICD-10-CM

## 2018-07-01 DIAGNOSIS — O34219 Maternal care for unspecified type scar from previous cesarean delivery: Secondary | ICD-10-CM | POA: Diagnosis not present

## 2018-07-01 DIAGNOSIS — Z362 Encounter for other antenatal screening follow-up: Secondary | ICD-10-CM

## 2018-07-01 DIAGNOSIS — Z3A31 31 weeks gestation of pregnancy: Secondary | ICD-10-CM

## 2018-07-01 DIAGNOSIS — O09893 Supervision of other high risk pregnancies, third trimester: Secondary | ICD-10-CM

## 2018-07-01 DIAGNOSIS — Z6841 Body Mass Index (BMI) 40.0 and over, adult: Secondary | ICD-10-CM

## 2018-07-01 DIAGNOSIS — O99213 Obesity complicating pregnancy, third trimester: Secondary | ICD-10-CM | POA: Insufficient documentation

## 2018-07-07 ENCOUNTER — Telehealth: Payer: Self-pay | Admitting: Obstetrics and Gynecology

## 2018-07-07 ENCOUNTER — Encounter: Payer: Self-pay | Admitting: Obstetrics and Gynecology

## 2018-07-07 ENCOUNTER — Ambulatory Visit (INDEPENDENT_AMBULATORY_CARE_PROVIDER_SITE_OTHER): Payer: Medicaid Other | Admitting: Obstetrics and Gynecology

## 2018-07-07 VITALS — BP 112/67 | HR 86

## 2018-07-07 DIAGNOSIS — O361930 Maternal care for other isoimmunization, third trimester, not applicable or unspecified: Secondary | ICD-10-CM | POA: Diagnosis not present

## 2018-07-07 DIAGNOSIS — O09293 Supervision of pregnancy with other poor reproductive or obstetric history, third trimester: Secondary | ICD-10-CM | POA: Diagnosis not present

## 2018-07-07 DIAGNOSIS — O0990 Supervision of high risk pregnancy, unspecified, unspecified trimester: Secondary | ICD-10-CM

## 2018-07-07 DIAGNOSIS — O99013 Anemia complicating pregnancy, third trimester: Secondary | ICD-10-CM

## 2018-07-07 DIAGNOSIS — O99210 Obesity complicating pregnancy, unspecified trimester: Secondary | ICD-10-CM

## 2018-07-07 DIAGNOSIS — Z8759 Personal history of other complications of pregnancy, childbirth and the puerperium: Secondary | ICD-10-CM

## 2018-07-07 DIAGNOSIS — O99213 Obesity complicating pregnancy, third trimester: Secondary | ICD-10-CM | POA: Diagnosis not present

## 2018-07-07 DIAGNOSIS — Z3A32 32 weeks gestation of pregnancy: Secondary | ICD-10-CM

## 2018-07-07 DIAGNOSIS — Z98891 History of uterine scar from previous surgery: Secondary | ICD-10-CM

## 2018-07-07 DIAGNOSIS — O361910 Maternal care for other isoimmunization, first trimester, not applicable or unspecified: Secondary | ICD-10-CM

## 2018-07-07 DIAGNOSIS — O99019 Anemia complicating pregnancy, unspecified trimester: Secondary | ICD-10-CM

## 2018-07-07 NOTE — Telephone Encounter (Signed)
Called patient with her next virtual visit. Patient verbalized understanding of it being a virtual visit.

## 2018-07-07 NOTE — Progress Notes (Addendum)
I connected with  Catherine Cochran on 07/07/18 at  8:15 AM EDT by telephone and verified that I am speaking with the correct person using two identifiers.   I discussed the limitations, risks, security and privacy concerns of performing an evaluation and management service by telephone and the availability of in person appointments. I also discussed with the patient that there may be a patient responsible charge related to this service. The patient expressed understanding and agreed to proceed.  Linda,RN 07/07/2018  8:23 AM

## 2018-07-07 NOTE — Progress Notes (Signed)
   TELEHEALTH VIRTUAL OBSTETRICS VISIT ENCOUNTER NOTE  I connected with Catherine Cochran on 07/07/18 at  8:15 AM EDT by telephone at home and verified that I am speaking with the correct person using two identifiers.   I discussed the limitations, risks, security and privacy concerns of performing an evaluation and management service by telephone and the availability of in person appointments. I also discussed with the patient that there may be a patient responsible charge related to this service. The patient expressed understanding and agreed to proceed.  Subjective:  Catherine Cochran is a 23 y.o. G3P2002 at [redacted]w[redacted]d being followed for ongoing prenatal care.  She is currently monitored for the following issues for this high-risk pregnancy and has Supervision of high risk pregnancy, antepartum; History of gestational hypertension; History of 2 cesarean sections; Lewis isoimmunization during pregnancy; Obesity in pregnancy; and Anemia in pregnancy on their problem list.  Patient reports no complaints. Reports fetal movement. Denies any contractions, bleeding or leaking of fluid.   The following portions of the patient's history were reviewed and updated as appropriate: allergies, current medications, past family history, past medical history, past social history, past surgical history and problem list.   Objective:   General:  Alert, oriented and cooperative.   Mental Status: Normal mood and affect perceived. Normal judgment and thought content.  Rest of physical exam deferred due to type of encounter  Assessment and Plan:  Pregnancy: G3P2002 at [redacted]w[redacted]d 1. Supervision of high risk pregnancy, antepartum Stable U/S 07/01/18 51% F/U growth on 5/12//20  2. History of gestational hypertension BP stable No meds   3. Obesity in pregnancy Stable  4. Antepartum anemia Taking iron bid  5. Lewis isoimmunization during pregnancy in first trimester, single or unspecified fetus Stable, titers  6. History  of 2 cesarean sections For repeat  Preterm labor symptoms and general obstetric precautions including but not limited to vaginal bleeding, contractions, leaking of fluid and fetal movement were reviewed in detail with the patient.  I discussed the assessment and treatment plan with the patient. The patient was provided an opportunity to ask questions and all were answered. The patient agreed with the plan and demonstrated an understanding of the instructions. The patient was advised to call back or seek an in-person office evaluation/go to MAU at [facility] for any urgent or concerning symptoms. Please refer to After Visit Summary for other counseling recommendations.   I provided 11 minutes of non-face-to-face time during this encounter.  Return in about 2 weeks (around 07/21/2018) for virtual visit.  Future Appointments  Date Time Provider Department Center  07/29/2018 10:45 AM WH-MFC [place] 2 WH-MFCUS MFC-US    Hermina Staggers, MD Center for [facility], [facility]

## 2018-07-07 NOTE — Progress Notes (Signed)
1117 I called Dixie to start virtual visit. I left a message I am calling for your virtual visit and will call back in a few minutes.  Latysha Thackston,RN

## 2018-07-16 NOTE — Telephone Encounter (Signed)
Opened in error

## 2018-07-21 ENCOUNTER — Ambulatory Visit (INDEPENDENT_AMBULATORY_CARE_PROVIDER_SITE_OTHER): Payer: Medicaid Other | Admitting: Obstetrics & Gynecology

## 2018-07-21 VITALS — BP 128/73

## 2018-07-21 DIAGNOSIS — O34219 Maternal care for unspecified type scar from previous cesarean delivery: Secondary | ICD-10-CM | POA: Diagnosis not present

## 2018-07-21 DIAGNOSIS — O99210 Obesity complicating pregnancy, unspecified trimester: Secondary | ICD-10-CM

## 2018-07-21 DIAGNOSIS — O99019 Anemia complicating pregnancy, unspecified trimester: Secondary | ICD-10-CM

## 2018-07-21 DIAGNOSIS — O361920 Maternal care for other isoimmunization, second trimester, not applicable or unspecified: Secondary | ICD-10-CM

## 2018-07-21 DIAGNOSIS — O0990 Supervision of high risk pregnancy, unspecified, unspecified trimester: Secondary | ICD-10-CM

## 2018-07-21 DIAGNOSIS — O09293 Supervision of pregnancy with other poor reproductive or obstetric history, third trimester: Secondary | ICD-10-CM | POA: Diagnosis not present

## 2018-07-21 DIAGNOSIS — Z8759 Personal history of other complications of pregnancy, childbirth and the puerperium: Secondary | ICD-10-CM

## 2018-07-21 DIAGNOSIS — O0993 Supervision of high risk pregnancy, unspecified, third trimester: Secondary | ICD-10-CM

## 2018-07-21 DIAGNOSIS — Z3A34 34 weeks gestation of pregnancy: Secondary | ICD-10-CM

## 2018-07-21 DIAGNOSIS — Z98891 History of uterine scar from previous surgery: Secondary | ICD-10-CM

## 2018-07-21 DIAGNOSIS — O99213 Obesity complicating pregnancy, third trimester: Secondary | ICD-10-CM

## 2018-07-21 DIAGNOSIS — O99013 Anemia complicating pregnancy, third trimester: Secondary | ICD-10-CM

## 2018-07-21 NOTE — Progress Notes (Signed)
12:57 I called [name] for her virtual visit. I left a message we are calling for her virtual visit and will call back.   I connected with  Catherine Cochran on 07/21/18 at  1:15 PM EDT by telephone and verified that I am speaking with the correct person using two identifiers.   I discussed the limitations, risks, security and privacy concerns of performing an evaluation and management service by telephone and the availability of in person appointments. I also discussed with the patient that there may be a patient responsible charge related to this service. The patient expressed understanding and agreed to proceed. She c/o lower back pain at times, I offered to give her an rx for maternity belt or she could buy one from [employer]/ etc. She opts to buy one from [place].   [name] is active in babyscripts- last bp 111/78 on 07/20/18.  Jontez Redfield,RN 07/21/2018  1:10 PM

## 2018-07-21 NOTE — Progress Notes (Signed)
   TELEHEALTH VIRTUAL OBSTETRICS PRENATAL VISIT ENCOUNTER NOTE  I connected with Catherine Cochran on 07/21/18 at  1:15 PM EDT by WebEx at home and verified that I am speaking with the correct person using two identifiers.   I discussed the limitations, risks, security and privacy concerns of performing an evaluation and management service by telephone and the availability of in person appointments. I also discussed with the patient that there may be a patient responsible charge related to this service. The patient expressed understanding and agreed to proceed. Subjective:  Catherine Cochran is a 23 y.o. G3P2002 at [redacted]w[redacted]d being seen today for ongoing prenatal care.  She is currently monitored for the following issues for this high pregnancy and has Supervision of high risk pregnancy, antepartum; History of gestational hypertension; History of 2 cesarean sections; Lewis isoimmunization during pregnancy; Obesity in pregnancy; and Anemia in pregnancy on their problem list.  Patient reports no problems.  Reports fetal movement. Contractions: Not present. Vag. Bleeding: None.  Movement: Present. Denies any contractions, bleeding or leaking of fluid.   The following portions of the patient's history were reviewed and updated as appropriate: allergies, current medications, past family history, past medical history, past social history, past surgical history and problem list.   Objective:   Vitals:   07/21/18 1311  BP: 128/73    Fetal Status:     Movement: Present     General:  Alert, oriented and cooperative. Patient is in no acute distress.  Respiratory: Normal respiratory effort, no problems with respiration noted  Mental Status: Normal mood and affect. Normal behavior. Normal judgment and thought content.  Rest of physical exam deferred due to type of encounter  Assessment and Plan:  Pregnancy: G3P2002 at [redacted]w[redacted]d 1. Supervision of high risk pregnancy, antepartum - has followup MFM u/s 07-29-18  2.  History of gestational hypertension - on baby asa  3. History of 2 cesarean sections - for repeat, on June 8 at 9:30  4. Obesity in pregnancy - no scale at home  5. Lewis isoimmunization during pregnancy in second trimester, single or unspecified fetus  6. Antepartum anemia - on iron  Preterm abor symptoms and general obstetric precautions including but not limited to vaginal bleeding, contractions, leaking of fluid and fetal movement were reviewed in detail with the patient. I discussed the assessment and treatment plan with the patient. The patient was provided an opportunity to ask questions and all were answered. The patient agreed with the plan and demonstrated an understanding of the instructions. The patient was advised to call back or seek an in-person office evaluation/go to MAU at [facility] for any urgent or concerning symptoms. Please refer to After Visit Summary for other counseling recommendations.   I provided 10 minutes of face-to-face via WebEx time during this encounter.  No follow-ups on file.  Future Appointments  Date Time Provider Department Center  07/29/2018 10:45 AM [facility] [place] 2 WH-M[facility] M[facility]-US    Allie Bossier, MD Center for [employer], [facility]

## 2018-07-29 ENCOUNTER — Ambulatory Visit (HOSPITAL_COMMUNITY)
Admission: RE | Admit: 2018-07-29 | Discharge: 2018-07-29 | Disposition: A | Discharge status: Home / Self Care | Payer: Medicaid Other | Source: Ambulatory Visit | Attending: Obstetrics and Gynecology | Admitting: Obstetrics and Gynecology

## 2018-07-29 ENCOUNTER — Other Ambulatory Visit: Payer: Self-pay

## 2018-07-29 DIAGNOSIS — O99213 Obesity complicating pregnancy, third trimester: Secondary | ICD-10-CM

## 2018-07-29 DIAGNOSIS — O09293 Supervision of pregnancy with other poor reproductive or obstetric history, third trimester: Secondary | ICD-10-CM

## 2018-07-29 DIAGNOSIS — Z6841 Body Mass Index (BMI) 40.0 and over, adult: Secondary | ICD-10-CM | POA: Diagnosis not present

## 2018-07-29 DIAGNOSIS — Z362 Encounter for other antenatal screening follow-up: Secondary | ICD-10-CM | POA: Diagnosis not present

## 2018-07-29 DIAGNOSIS — O09893 Supervision of other high risk pregnancies, third trimester: Secondary | ICD-10-CM

## 2018-07-29 DIAGNOSIS — O34219 Maternal care for unspecified type scar from previous cesarean delivery: Secondary | ICD-10-CM | POA: Diagnosis not present

## 2018-07-29 DIAGNOSIS — Z3A35 35 weeks gestation of pregnancy: Secondary | ICD-10-CM

## 2018-08-01 ENCOUNTER — Telehealth: Payer: Self-pay | Admitting: Obstetrics and Gynecology

## 2018-08-01 NOTE — Telephone Encounter (Signed)
Called the patient to inform of upcoming visit, left a detailed voicemail of appointment info and new location address. °

## 2018-08-04 ENCOUNTER — Encounter: Payer: Self-pay | Admitting: Obstetrics and Gynecology

## 2018-08-04 ENCOUNTER — Other Ambulatory Visit: Payer: Self-pay

## 2018-08-04 ENCOUNTER — Ambulatory Visit (INDEPENDENT_AMBULATORY_CARE_PROVIDER_SITE_OTHER): Payer: Medicaid Other | Admitting: Obstetrics and Gynecology

## 2018-08-04 ENCOUNTER — Other Ambulatory Visit (HOSPITAL_COMMUNITY)
Admission: RE | Admit: 2018-08-04 | Discharge: 2018-08-04 | Disposition: A | Discharge status: Home / Self Care | Payer: Medicaid Other | Source: Ambulatory Visit | Attending: Obstetrics and Gynecology | Admitting: Obstetrics and Gynecology

## 2018-08-04 VITALS — BP 110/67 | HR 116 | Temp 98.9°F | Wt 292.1 lb

## 2018-08-04 DIAGNOSIS — O0990 Supervision of high risk pregnancy, unspecified, unspecified trimester: Secondary | ICD-10-CM | POA: Insufficient documentation

## 2018-08-04 DIAGNOSIS — Z8759 Personal history of other complications of pregnancy, childbirth and the puerperium: Secondary | ICD-10-CM

## 2018-08-04 DIAGNOSIS — Z3A36 36 weeks gestation of pregnancy: Secondary | ICD-10-CM

## 2018-08-04 DIAGNOSIS — Z98891 History of uterine scar from previous surgery: Secondary | ICD-10-CM

## 2018-08-04 NOTE — Progress Notes (Signed)
Subjective:  Catherine Cochran is a 23 y.o. G3P2002 at [redacted]w[redacted]d being seen today for ongoing prenatal care.  She is currently monitored for the following issues for this low-risk pregnancy and has Supervision of high risk pregnancy, antepartum; History of gestational hypertension; History of 2 cesarean sections; Lewis isoimmunization during pregnancy; Obesity in pregnancy; and Anemia in pregnancy on their problem list.  Patient reports general discomforts of pregnancy.  Contractions: Not present. Vag. Bleeding: None.  Movement: Present. Denies leaking of fluid.   The following portions of the patient's history were reviewed and updated as appropriate: allergies, current medications, past family history, past medical history, past social history, past surgical history and problem list. Problem list updated.  Objective:   Vitals:   08/04/18 1413  BP: 110/67  Pulse: (!) 116  Temp: 98.9 F (37.2 C)  Weight: 132.5 kg    Fetal Status: Fetal Heart Rate (bpm): 153   Movement: Present     General:  Alert, oriented and cooperative. Patient is in no acute distress.  Skin: Skin is warm and dry. No rash noted.   Cardiovascular: Normal heart rate noted  Respiratory: Normal respiratory effort, no problems with respiration noted  Abdomen: Soft, gravid, appropriate for gestational age. Pain/Pressure: Present     Pelvic:  Cervical exam performed        Extremities: Normal range of motion.  Edema: None  Mental Status: Normal mood and affect. Normal behavior. Normal judgment and thought content.   Urinalysis:      Assessment and Plan:  Pregnancy: G3P2002 at [redacted]w[redacted]d  1. Supervision of high risk pregnancy, antepartum Stable Labor precautions - Culture, beta strep (group b only) - GC/Chlamydia probe amp ([place])not at [facility]  2. History of 2 cesarean sections For repeat on 08/25/18 Considering IUD  3. History of gestational hypertension BP stable  No S/Sx of PEC Continue with qd BASA  Term labor  symptoms and general obstetric precautions including but not limited to vaginal bleeding, contractions, leaking of fluid and fetal movement were reviewed in detail with the patient. Please refer to After Visit Summary for other counseling recommendations.  Return in about 2 weeks (around 08/18/2018) for OB visit, televisit.   Hermina Staggers, MD

## 2018-08-04 NOTE — Patient Instructions (Signed)
Third Trimester of Pregnancy The third trimester is from week 28 through week 40 (months 7 through 9). The third trimester is a time when the unborn baby (fetus) is growing rapidly. At the end of the ninth month, the fetus is about 20 inches in length and weighs 6-10 pounds. Body changes during your third trimester Your body will continue to go through many changes during pregnancy. The changes vary from woman to woman. During the third trimester:  Your weight will continue to increase. You can expect to gain 25-35 pounds (11-16 kg) by the end of the pregnancy.  You may begin to get stretch marks on your hips, abdomen, and breasts.  You may urinate more often because the fetus is moving lower into your pelvis and pressing on your bladder.  You may develop or continue to have heartburn. This is caused by increased hormones that slow down muscles in the digestive tract.  You may develop or continue to have constipation because increased hormones slow digestion and cause the muscles that push waste through your intestines to relax.  You may develop hemorrhoids. These are swollen veins (varicose veins) in the rectum that can itch or be painful.  You may develop swollen, bulging veins (varicose veins) in your legs.  You may have increased body aches in the pelvis, back, or thighs. This is due to weight gain and increased hormones that are relaxing your joints.  You may have changes in your hair. These can include thickening of your hair, rapid growth, and changes in texture. Some women also have hair loss during or after pregnancy, or hair that feels dry or thin. Your hair will most likely return to normal after your baby is born.  Your breasts will continue to grow and they will continue to become tender. A yellow fluid (colostrum) may leak from your breasts. This is the first milk you are producing for your baby.  Your belly button may stick out.  You may notice more swelling in your hands,  face, or ankles.  You may have increased tingling or numbness in your hands, arms, and legs. The skin on your belly may also feel numb.  You may feel short of breath because of your expanding uterus.  You may have more problems sleeping. This can be caused by the size of your belly, increased need to urinate, and an increase in your body's metabolism.  You may notice the fetus "dropping," or moving lower in your abdomen (lightening).  You may have increased vaginal discharge.  You may notice your joints feel loose and you may have pain around your pelvic bone. What to expect at prenatal visits You will have prenatal exams every 2 weeks until week 36. Then you will have weekly prenatal exams. During a routine prenatal visit:  You will be weighed to make sure you and the baby are growing normally.  Your blood pressure will be taken.  Your abdomen will be measured to track your baby's growth.  The fetal heartbeat will be listened to.  Any test results from the previous visit will be discussed.  You may have a cervical check near your due date to see if your cervix has softened or thinned (effaced).  You will be tested for Group B streptococcus. This happens between 35 and 37 weeks. Your health care provider may ask you:  What your birth plan is.  How you are feeling.  If you are feeling the baby move.  If you have had any abnormal   symptoms, such as leaking fluid, bleeding, severe headaches, or abdominal cramping.  If you are using any tobacco products, including cigarettes, chewing tobacco, and electronic cigarettes.  If you have any questions. Other tests or screenings that may be performed during your third trimester include:  Blood tests that check for low iron levels (anemia).  Fetal testing to check the health, activity level, and growth of the fetus. Testing is done if you have certain medical conditions or if there are problems during the pregnancy.  Nonstress test  (NST). This test checks the health of your baby to make sure there are no signs of problems, such as the baby not getting enough oxygen. During this test, a belt is placed around your belly. The baby is made to move, and its heart rate is monitored during movement. What is false labor? False labor is a condition in which you feel small, irregular tightenings of the muscles in the womb (contractions) that usually go away with rest, changing position, or drinking water. These are called Braxton Hicks contractions. Contractions may last for hours, days, or even weeks before true labor sets in. If contractions come at regular intervals, become more frequent, increase in intensity, or become painful, you should see your health care provider. What are the signs of labor?  Abdominal cramps.  Regular contractions that start at 10 minutes apart and become stronger and more frequent with time.  Contractions that start on the top of the uterus and spread down to the lower abdomen and back.  Increased pelvic pressure and dull back pain.  A watery or bloody mucus discharge that comes from the vagina.  Leaking of amniotic fluid. This is also known as your "water breaking." It could be a slow trickle or a gush. Let your health care provider know if it has a color or strange odor. If you have any of these signs, call your health care provider right away, even if it is before your due date. Follow these instructions at home: Medicines  Follow your health care provider's instructions regarding medicine use. Specific medicines may be either safe or unsafe to take during pregnancy.  Take a prenatal vitamin that contains at least 600 micrograms (mcg) of folic acid.  If you develop constipation, try taking a stool softener if your health care provider approves. Eating and drinking   Eat a balanced diet that includes fresh fruits and vegetables, whole grains, good sources of protein such as meat, eggs, or tofu,  and low-fat dairy. Your health care provider will help you determine the amount of weight gain that is right for you.  Avoid raw meat and uncooked cheese. These carry germs that can cause birth defects in the baby.  If you have low calcium intake from food, talk to your health care provider about whether you should take a daily calcium supplement.  Eat four or five small meals rather than three large meals a day.  Limit foods that are high in fat and processed sugars, such as fried and sweet foods.  To prevent constipation: ? Drink enough fluid to keep your urine clear or pale yellow. ? Eat foods that are high in fiber, such as fresh fruits and vegetables, whole grains, and beans. Activity  Exercise only as directed by your health care provider. Most women can continue their usual exercise routine during pregnancy. Try to exercise for 30 minutes at least 5 days a week. Stop exercising if you experience uterine contractions.  Avoid heavy lifting.  Do   not exercise in extreme heat or humidity, or at high altitudes.  Wear low-heel, comfortable shoes.  Practice good posture.  You may continue to have sex unless your health care provider tells you otherwise. Relieving pain and discomfort  Take frequent breaks and rest with your legs elevated if you have leg cramps or low back pain.  Take warm sitz baths to soothe any pain or discomfort caused by hemorrhoids. Use hemorrhoid cream if your health care provider approves.  Wear a good support bra to prevent discomfort from breast tenderness.  If you develop varicose veins: ? Wear support pantyhose or compression stockings as told by your healthcare provider. ? Elevate your feet for 15 minutes, 3-4 times a day. Prenatal care  Write down your questions. Take them to your prenatal visits.  Keep all your prenatal visits as told by your health care provider. This is important. Safety  Wear your seat belt at all times when driving.  Make  a list of emergency phone numbers, including numbers for family, friends, the hospital, and police and fire departments. General instructions  Avoid cat litter boxes and soil used by cats. These carry germs that can cause birth defects in the baby. If you have a cat, ask someone to clean the litter box for you.  Do not travel far distances unless it is absolutely necessary and only with the approval of your health care provider.  Do not use hot tubs, steam rooms, or saunas.  Do not drink alcohol.  Do not use any products that contain nicotine or tobacco, such as cigarettes and e-cigarettes. If you need help quitting, ask your health care provider.  Do not use any medicinal herbs or unprescribed drugs. These chemicals affect the formation and growth of the baby.  Do not douche or use tampons or scented sanitary pads.  Do not cross your legs for long periods of time.  To prepare for the arrival of your baby: ? Take prenatal classes to understand, practice, and ask questions about labor and delivery. ? Make a trial run to the hospital. ? Visit the hospital and tour the maternity area. ? Arrange for maternity or paternity leave through employers. ? Arrange for family and friends to take care of pets while you are in the hospital. ? Purchase a rear-facing car seat and make sure you know how to install it in your car. ? Pack your hospital bag. ? Prepare the baby's nursery. Make sure to remove all pillows and stuffed animals from the baby's crib to prevent suffocation.  Visit your dentist if you have not gone during your pregnancy. Use a soft toothbrush to brush your teeth and be gentle when you floss. Contact a health care provider if:  You are unsure if you are in labor or if your water has broken.  You become dizzy.  You have mild pelvic cramps, pelvic pressure, or nagging pain in your abdominal area.  You have lower back pain.  You have persistent nausea, vomiting, or  diarrhea.  You have an unusual or bad smelling vaginal discharge.  You have pain when you urinate. Get help right away if:  Your water breaks before 37 weeks.  You have regular contractions less than 5 minutes apart before 37 weeks.  You have a fever.  You are leaking fluid from your vagina.  You have spotting or bleeding from your vagina.  You have severe abdominal pain or cramping.  You have rapid weight loss or weight gain.  You have   shortness of breath with chest pain.  You notice sudden or extreme swelling of your face, hands, ankles, feet, or legs.  Your baby makes fewer than 10 movements in 2 hours.  You have severe headaches that do not go away when you take medicine.  You have vision changes. Summary  The third trimester is from week 28 through week 40, months 7 through 9. The third trimester is a time when the unborn baby (fetus) is growing rapidly.  During the third trimester, your discomfort may increase as you and your baby continue to gain weight. You may have abdominal, leg, and back pain, sleeping problems, and an increased need to urinate.  During the third trimester your breasts will keep growing and they will continue to become tender. A yellow fluid (colostrum) may leak from your breasts. This is the first milk you are producing for your baby.  False labor is a condition in which you feel small, irregular tightenings of the muscles in the womb (contractions) that eventually go away. These are called Braxton Hicks contractions. Contractions may last for hours, days, or even weeks before true labor sets in.  Signs of labor can include: abdominal cramps; regular contractions that start at 10 minutes apart and become stronger and more frequent with time; watery or bloody mucus discharge that comes from the vagina; increased pelvic pressure and dull back pain; and leaking of amniotic fluid. This information is not intended to replace advice given to you by your  health care provider. Make sure you discuss any questions you have with your health care provider. Document Released: 02/27/2001 Document Revised: 04/10/2016 Document Reviewed: 04/10/2016 Elsevier Interactive Patient Education  2019 [employer]  

## 2018-08-05 LAB — GC/CHLAMYDIA PROBE AMP (~~LOC~~) NOT AT ARMC
Chlamydia: NEGATIVE
Neisseria Gonorrhea: NEGATIVE

## 2018-08-08 LAB — CULTURE, BETA STREP (GROUP B ONLY): Strep Gp B Culture: POSITIVE — AB

## 2018-08-12 ENCOUNTER — Encounter (HOSPITAL_COMMUNITY): Payer: Self-pay | Admitting: *Deleted

## 2018-08-12 NOTE — Patient Instructions (Signed)
Lakelee Deveraux  08/12/2018   Your procedure is scheduled on:  08/25/2018  Arrive at 0730 at Entrance C on [address] at [facility]  and [employer]. You are invited to use the FREE valet parking or use the Visitor's parking deck.  Pick up the phone at the desk and dial [phone].  Call this number if you have problems the morning of surgery: [phone]  Remember:   Do not eat food:(After Midnight) Desps de medianoche.  Do not drink clear liquids: (After Midnight) Desps de medianoche.  Take these medicines the morning of surgery with A SIP OF WATER:  none   Do not wear jewelry, make-up or nail polish.  Do not wear lotions, powders, or perfumes. Do not wear deodorant.  Do not shave 48 hours prior to surgery.  Do not bring valuables to the hospital.  [facility] is not   responsible for any belongings or valuables brought to the hospital.  Contacts, dentures or bridgework may not be worn into surgery.  Leave suitcase in the car. After surgery it may be brought to your room.  For patients admitted to the hospital, checkout time is 11:00 AM the day of              discharge.      Please read over the following fact sheets that you were given:     Preparing for Surgery

## 2018-08-18 ENCOUNTER — Other Ambulatory Visit: Payer: Self-pay

## 2018-08-18 ENCOUNTER — Telehealth: Payer: Medicaid Other | Admitting: Obstetrics & Gynecology

## 2018-08-18 VITALS — BP 117/85 | HR 94

## 2018-08-18 DIAGNOSIS — O99820 Streptococcus B carrier state complicating pregnancy: Secondary | ICD-10-CM

## 2018-08-18 DIAGNOSIS — Z8759 Personal history of other complications of pregnancy, childbirth and the puerperium: Secondary | ICD-10-CM

## 2018-08-18 DIAGNOSIS — O361920 Maternal care for other isoimmunization, second trimester, not applicable or unspecified: Secondary | ICD-10-CM

## 2018-08-18 DIAGNOSIS — Z98891 History of uterine scar from previous surgery: Secondary | ICD-10-CM

## 2018-08-18 DIAGNOSIS — O0990 Supervision of high risk pregnancy, unspecified, unspecified trimester: Secondary | ICD-10-CM

## 2018-08-18 DIAGNOSIS — O99210 Obesity complicating pregnancy, unspecified trimester: Secondary | ICD-10-CM

## 2018-08-18 HISTORY — DX: Streptococcus B carrier state complicating pregnancy: O99.820

## 2018-08-18 NOTE — Progress Notes (Signed)
I connected with  Catherine Cochran on 08/18/18 at 10:15 AM EDT by telephone and verified that I am speaking with the correct person using two identifiers.   I discussed the limitations, risks, security and privacy concerns of performing an evaluation and management service by telephone and the availability of in person appointments. I also discussed with the patient that there may be a patient responsible charge related to this service. The patient expressed understanding and agreed to proceed.  Osvaldo Human, RN 08/18/2018  10:12 AM   Pt able to check BPs and record them in [employer].

## 2018-08-21 ENCOUNTER — Other Ambulatory Visit: Payer: Self-pay

## 2018-08-21 ENCOUNTER — Other Ambulatory Visit (HOSPITAL_COMMUNITY)
Admission: RE | Admit: 2018-08-21 | Discharge: 2018-08-21 | Disposition: A | Discharge status: Home / Self Care | Payer: Medicaid Other | Source: Ambulatory Visit | Attending: Obstetrics and Gynecology | Admitting: Obstetrics and Gynecology

## 2018-08-21 DIAGNOSIS — Z1159 Encounter for screening for other viral diseases: Secondary | ICD-10-CM | POA: Insufficient documentation

## 2018-08-21 NOTE — MAU Note (Signed)
Swab collected without difficulty. 

## 2018-08-22 LAB — NOVEL CORONAVIRUS, NAA (HOSP ORDER, SEND-OUT TO REF LAB; TAT 18-24 HRS): SARS-CoV-2, NAA: NOT DETECTED

## 2018-08-25 ENCOUNTER — Inpatient Hospital Stay (HOSPITAL_COMMUNITY): Payer: Medicaid Other | Admitting: Anesthesiology

## 2018-08-25 ENCOUNTER — Encounter (HOSPITAL_COMMUNITY)
Admission: RE | Disposition: A | Discharge status: Home / Self Care | Payer: Self-pay | Source: Home / Self Care | Attending: Obstetrics and Gynecology

## 2018-08-25 ENCOUNTER — Encounter (HOSPITAL_COMMUNITY): Payer: Self-pay

## 2018-08-25 ENCOUNTER — Other Ambulatory Visit: Payer: Self-pay

## 2018-08-25 ENCOUNTER — Inpatient Hospital Stay (HOSPITAL_COMMUNITY)
Admission: RE | Admit: 2018-08-25 | Discharge: 2018-08-27 | DRG: 788 | Disposition: A | Discharge status: Home / Self Care | Payer: Medicaid Other | Attending: Obstetrics and Gynecology | Admitting: Obstetrics and Gynecology

## 2018-08-25 DIAGNOSIS — O99824 Streptococcus B carrier state complicating childbirth: Secondary | ICD-10-CM | POA: Diagnosis not present

## 2018-08-25 DIAGNOSIS — Z975 Presence of (intrauterine) contraceptive device: Secondary | ICD-10-CM

## 2018-08-25 DIAGNOSIS — D649 Anemia, unspecified: Secondary | ICD-10-CM | POA: Diagnosis present

## 2018-08-25 DIAGNOSIS — O99210 Obesity complicating pregnancy, unspecified trimester: Secondary | ICD-10-CM | POA: Diagnosis present

## 2018-08-25 DIAGNOSIS — O34211 Maternal care for low transverse scar from previous cesarean delivery: Secondary | ICD-10-CM | POA: Diagnosis not present

## 2018-08-25 DIAGNOSIS — Z98891 History of uterine scar from previous surgery: Secondary | ICD-10-CM

## 2018-08-25 DIAGNOSIS — Z3A38 38 weeks gestation of pregnancy: Secondary | ICD-10-CM | POA: Diagnosis not present

## 2018-08-25 DIAGNOSIS — O9902 Anemia complicating childbirth: Secondary | ICD-10-CM | POA: Diagnosis present

## 2018-08-25 DIAGNOSIS — Z30430 Encounter for insertion of intrauterine contraceptive device: Secondary | ICD-10-CM

## 2018-08-25 DIAGNOSIS — Z3A39 39 weeks gestation of pregnancy: Secondary | ICD-10-CM | POA: Diagnosis not present

## 2018-08-25 DIAGNOSIS — O99214 Obesity complicating childbirth: Secondary | ICD-10-CM | POA: Diagnosis present

## 2018-08-25 DIAGNOSIS — O99019 Anemia complicating pregnancy, unspecified trimester: Secondary | ICD-10-CM | POA: Diagnosis present

## 2018-08-25 HISTORY — DX: History of uterine scar from previous surgery: Z98.891

## 2018-08-25 HISTORY — DX: Presence of (intrauterine) contraceptive device: Z97.5

## 2018-08-25 LAB — CBC
HCT: 31.7 % — ABNORMAL LOW (ref 36.0–46.0)
Hemoglobin: 9.6 g/dL — ABNORMAL LOW (ref 12.0–15.0)
MCH: 25.7 pg — ABNORMAL LOW (ref 26.0–34.0)
MCHC: 30.3 g/dL (ref 30.0–36.0)
MCV: 85 fL (ref 80.0–100.0)
Platelets: 314 10*3/uL (ref 150–400)
RBC: 3.73 MIL/uL — ABNORMAL LOW (ref 3.87–5.11)
RDW: 16.4 % — ABNORMAL HIGH (ref 11.5–15.5)
WBC: 12.6 10*3/uL — ABNORMAL HIGH (ref 4.0–10.5)
nRBC: 0 % (ref 0.0–0.2)

## 2018-08-25 LAB — RPR: RPR Ser Ql: NONREACTIVE

## 2018-08-25 SURGERY — Surgical Case
Anesthesia: Spinal

## 2018-08-25 MED ORDER — PHENYLEPHRINE HCL-NACL 20-0.9 MG/250ML-% IV SOLN
INTRAVENOUS | Status: AC
  Filled 2018-08-25: qty 250

## 2018-08-25 MED ORDER — SCOPOLAMINE 1 MG/3DAYS TD PT72
MEDICATED_PATCH | TRANSDERMAL | Status: AC
  Filled 2018-08-25: qty 1

## 2018-08-25 MED ORDER — SOD CITRATE-CITRIC ACID 500-334 MG/5ML PO SOLN
ORAL | Status: AC
  Filled 2018-08-25: qty 30

## 2018-08-25 MED ORDER — BUPIVACAINE HCL (PF) 0.5 % IJ SOLN
INTRAMUSCULAR | Status: DC | PRN
  Administered 2018-08-25: 30 mL

## 2018-08-25 MED ORDER — NALBUPHINE HCL 10 MG/ML IJ SOLN: 5.0000 mg | INTRAMUSCULAR | Status: DC | PRN

## 2018-08-25 MED ORDER — KETOROLAC TROMETHAMINE 30 MG/ML IJ SOLN
30.0000 mg | Freq: Four times a day (QID) | INTRAMUSCULAR | Status: DC | PRN
  Administered 2018-08-25: 30 mg via INTRAVENOUS

## 2018-08-25 MED ORDER — ZOLPIDEM TARTRATE 5 MG PO TABS: 5.0000 mg | ORAL_TABLET | Freq: Every evening | ORAL | Status: DC | PRN

## 2018-08-25 MED ORDER — TETANUS-DIPHTH-ACELL PERTUSSIS 5-2.5-18.5 LF-MCG/0.5 IM SUSP: 0.5000 mL | Freq: Once | INTRAMUSCULAR | Status: DC

## 2018-08-25 MED ORDER — NALOXONE HCL 4 MG/10ML IJ SOLN
1.0000 ug/kg/h | INTRAVENOUS | Status: DC | PRN
  Filled 2018-08-25: qty 5

## 2018-08-25 MED ORDER — MORPHINE SULFATE (PF) 0.5 MG/ML IJ SOLN
INTRAMUSCULAR | Status: DC | PRN
  Administered 2018-08-25: .15 mg via INTRATHECAL

## 2018-08-25 MED ORDER — COCONUT OIL OIL: 1.0000 "application " | TOPICAL_OIL | Status: DC | PRN

## 2018-08-25 MED ORDER — FENTANYL CITRATE (PF) 100 MCG/2ML IJ SOLN
INTRAMUSCULAR | Status: DC | PRN
  Administered 2018-08-25: 15 ug via INTRATHECAL

## 2018-08-25 MED ORDER — CEFAZOLIN SODIUM-DEXTROSE 2-4 GM/100ML-% IV SOLN
INTRAVENOUS | Status: AC
  Filled 2018-08-25: qty 100

## 2018-08-25 MED ORDER — BUPIVACAINE IN DEXTROSE 0.75-8.25 % IT SOLN
INTRATHECAL | Status: DC | PRN
  Administered 2018-08-25: 1.6 mL via INTRATHECAL

## 2018-08-25 MED ORDER — KETOROLAC TROMETHAMINE 30 MG/ML IJ SOLN: 30.0000 mg | Freq: Four times a day (QID) | INTRAMUSCULAR | Status: DC | PRN

## 2018-08-25 MED ORDER — MEASLES, MUMPS & RUBELLA VAC IJ SOLR: 0.5000 mL | Freq: Once | INTRAMUSCULAR | Status: DC

## 2018-08-25 MED ORDER — PHENYLEPHRINE 40 MCG/ML (10ML) SYRINGE FOR IV PUSH (FOR BLOOD PRESSURE SUPPORT)
PREFILLED_SYRINGE | INTRAVENOUS | Status: DC | PRN
  Administered 2018-08-25: 40 ug via INTRAVENOUS

## 2018-08-25 MED ORDER — SCOPOLAMINE 1 MG/3DAYS TD PT72
1.0000 | MEDICATED_PATCH | Freq: Once | TRANSDERMAL | Status: DC
  Administered 2018-08-25: 1.5 mg via TRANSDERMAL

## 2018-08-25 MED ORDER — SIMETHICONE 80 MG PO CHEW
80.0000 mg | CHEWABLE_TABLET | Freq: Three times a day (TID) | ORAL | Status: DC
  Administered 2018-08-25 – 2018-08-27 (×6): 80 mg via ORAL
  Filled 2018-08-25 (×6): qty 1

## 2018-08-25 MED ORDER — SIMETHICONE 80 MG PO CHEW
80.0000 mg | CHEWABLE_TABLET | ORAL | Status: DC
  Administered 2018-08-25 – 2018-08-26 (×2): 80 mg via ORAL
  Filled 2018-08-25 (×2): qty 1

## 2018-08-25 MED ORDER — LACTATED RINGERS IV SOLN
INTRAVENOUS | Status: DC
  Administered 2018-08-25: 09:00:00 via INTRAVENOUS

## 2018-08-25 MED ORDER — FENTANYL CITRATE (PF) 100 MCG/2ML IJ SOLN: 25.0000 ug | INTRAMUSCULAR | Status: DC | PRN

## 2018-08-25 MED ORDER — KETOROLAC TROMETHAMINE 30 MG/ML IJ SOLN
INTRAMUSCULAR | Status: AC
  Filled 2018-08-25: qty 1

## 2018-08-25 MED ORDER — DIPHENHYDRAMINE HCL 50 MG/ML IJ SOLN: 12.5000 mg | INTRAMUSCULAR | Status: DC | PRN

## 2018-08-25 MED ORDER — DIBUCAINE (PERIANAL) 1 % EX OINT: 1.0000 "application " | TOPICAL_OINTMENT | CUTANEOUS | Status: DC | PRN

## 2018-08-25 MED ORDER — SOD CITRATE-CITRIC ACID 500-334 MG/5ML PO SOLN
30.0000 mL | ORAL | Status: AC
  Administered 2018-08-25: 30 mL via ORAL

## 2018-08-25 MED ORDER — SODIUM CHLORIDE 0.9 % IV SOLN
INTRAVENOUS | Status: DC | PRN
  Administered 2018-08-25: 13:00:00 via INTRAVENOUS

## 2018-08-25 MED ORDER — FENTANYL CITRATE (PF) 100 MCG/2ML IJ SOLN
INTRAMUSCULAR | Status: AC
  Filled 2018-08-25: qty 2

## 2018-08-25 MED ORDER — NALOXONE HCL 0.4 MG/ML IJ SOLN: 0.4000 mg | INTRAMUSCULAR | Status: DC | PRN

## 2018-08-25 MED ORDER — KETOROLAC TROMETHAMINE 30 MG/ML IJ SOLN
30.0000 mg | Freq: Four times a day (QID) | INTRAMUSCULAR | Status: DC
  Administered 2018-08-25 – 2018-08-26 (×2): 30 mg via INTRAVENOUS
  Filled 2018-08-25 (×2): qty 1

## 2018-08-25 MED ORDER — ONDANSETRON HCL 4 MG/2ML IJ SOLN
INTRAMUSCULAR | Status: AC
  Filled 2018-08-25: qty 2

## 2018-08-25 MED ORDER — ENOXAPARIN SODIUM 80 MG/0.8ML ~~LOC~~ SOLN
70.0000 mg | SUBCUTANEOUS | Status: DC
  Administered 2018-08-26: 70 mg via SUBCUTANEOUS
  Filled 2018-08-25: qty 0.8

## 2018-08-25 MED ORDER — MENTHOL 3 MG MT LOZG: 1.0000 | LOZENGE | OROMUCOSAL | Status: DC | PRN

## 2018-08-25 MED ORDER — SOD CITRATE-CITRIC ACID 500-334 MG/5ML PO SOLN: 30.0000 mL | Freq: Once | ORAL | Status: DC

## 2018-08-25 MED ORDER — PHENYLEPHRINE HCL-NACL 20-0.9 MG/250ML-% IV SOLN
INTRAVENOUS | Status: DC | PRN
  Administered 2018-08-25: 60 ug/min via INTRAVENOUS

## 2018-08-25 MED ORDER — LEVONORGESTREL 19.5 MCG/DAY IU IUD
INTRAUTERINE_SYSTEM | Freq: Once | INTRAUTERINE | Status: AC
  Administered 2018-08-25: 1 via INTRAUTERINE

## 2018-08-25 MED ORDER — OXYTOCIN 40 UNITS IN NORMAL SALINE INFUSION - SIMPLE MED: 2.5000 [IU]/h | INTRAVENOUS | Status: AC

## 2018-08-25 MED ORDER — ONDANSETRON HCL 4 MG/2ML IJ SOLN
INTRAMUSCULAR | Status: DC | PRN
  Administered 2018-08-25: 4 mg via INTRAVENOUS

## 2018-08-25 MED ORDER — PRENATAL MULTIVITAMIN CH
1.0000 | ORAL_TABLET | Freq: Every day | ORAL | Status: DC
  Administered 2018-08-26 – 2018-08-27 (×2): 1 via ORAL
  Filled 2018-08-25 (×2): qty 1

## 2018-08-25 MED ORDER — BUPIVACAINE HCL (PF) 0.5 % IJ SOLN
INTRAMUSCULAR | Status: AC
  Filled 2018-08-25: qty 30

## 2018-08-25 MED ORDER — MEPERIDINE HCL 25 MG/ML IJ SOLN: 6.2500 mg | INTRAMUSCULAR | Status: DC | PRN

## 2018-08-25 MED ORDER — ONDANSETRON HCL 4 MG/2ML IJ SOLN: 4.0000 mg | Freq: Three times a day (TID) | INTRAMUSCULAR | Status: DC | PRN

## 2018-08-25 MED ORDER — LACTATED RINGERS IV SOLN
INTRAVENOUS | Status: DC
  Administered 2018-08-25: 17:00:00 via INTRAVENOUS

## 2018-08-25 MED ORDER — SODIUM CHLORIDE 0.9% FLUSH: 3.0000 mL | INTRAVENOUS | Status: DC | PRN

## 2018-08-25 MED ORDER — DEXTROSE 5 % IV SOLN
INTRAVENOUS | Status: DC | PRN
  Administered 2018-08-25: 3 g via INTRAVENOUS

## 2018-08-25 MED ORDER — CEFAZOLIN SODIUM-DEXTROSE 2-4 GM/100ML-% IV SOLN: 2.0000 g | INTRAVENOUS | Status: DC

## 2018-08-25 MED ORDER — NALBUPHINE HCL 10 MG/ML IJ SOLN: 5.0000 mg | Freq: Once | INTRAMUSCULAR | Status: DC | PRN

## 2018-08-25 MED ORDER — SIMETHICONE 80 MG PO CHEW: 80.0000 mg | CHEWABLE_TABLET | ORAL | Status: DC | PRN

## 2018-08-25 MED ORDER — OXYTOCIN 40 UNITS IN NORMAL SALINE INFUSION - SIMPLE MED
INTRAVENOUS | Status: AC
  Filled 2018-08-25: qty 1000

## 2018-08-25 MED ORDER — DIPHENHYDRAMINE HCL 25 MG PO CAPS
25.0000 mg | ORAL_CAPSULE | Freq: Four times a day (QID) | ORAL | Status: DC | PRN
  Administered 2018-08-25: 25 mg via ORAL
  Filled 2018-08-25: qty 1

## 2018-08-25 MED ORDER — LEVONORGESTREL 19.5 MCG/DAY IU IUD
INTRAUTERINE_SYSTEM | INTRAUTERINE | Status: AC
  Filled 2018-08-25: qty 1

## 2018-08-25 MED ORDER — SENNOSIDES-DOCUSATE SODIUM 8.6-50 MG PO TABS
2.0000 | ORAL_TABLET | ORAL | Status: DC
  Administered 2018-08-25 – 2018-08-26 (×2): 2 via ORAL
  Filled 2018-08-25 (×2): qty 2

## 2018-08-25 MED ORDER — SODIUM CHLORIDE 0.9 % IV SOLN
INTRAVENOUS | Status: DC | PRN
  Administered 2018-08-25: 40 [IU] via INTRAVENOUS

## 2018-08-25 MED ORDER — GABAPENTIN 100 MG PO CAPS
100.0000 mg | ORAL_CAPSULE | Freq: Two times a day (BID) | ORAL | Status: DC
  Administered 2018-08-25 – 2018-08-27 (×4): 100 mg via ORAL
  Filled 2018-08-25 (×4): qty 1

## 2018-08-25 MED ORDER — WITCH HAZEL-GLYCERIN EX PADS: 1.0000 "application " | MEDICATED_PAD | CUTANEOUS | Status: DC | PRN

## 2018-08-25 MED ORDER — DIPHENHYDRAMINE HCL 25 MG PO CAPS: 25.0000 mg | ORAL_CAPSULE | ORAL | Status: DC | PRN

## 2018-08-25 MED ORDER — MORPHINE SULFATE (PF) 0.5 MG/ML IJ SOLN
INTRAMUSCULAR | Status: AC
  Filled 2018-08-25: qty 10

## 2018-08-25 MED ORDER — IBUPROFEN 800 MG PO TABS: 800.0000 mg | ORAL_TABLET | Freq: Four times a day (QID) | ORAL | Status: DC

## 2018-08-25 MED ORDER — HYDROCODONE-ACETAMINOPHEN 5-325 MG PO TABS
1.0000 | ORAL_TABLET | ORAL | Status: DC | PRN
  Administered 2018-08-26 – 2018-08-27 (×3): 1 via ORAL
  Filled 2018-08-25 (×3): qty 1

## 2018-08-25 MED ORDER — LACTATED RINGERS IV SOLN
INTRAVENOUS | Status: DC
  Administered 2018-08-25: 08:00:00 via INTRAVENOUS

## 2018-08-25 SURGICAL SUPPLY — 40 items
BENZOIN TINCTURE PRP APPL 2/3 (GAUZE/BANDAGES/DRESSINGS) ×2 IMPLANT
CHLORAPREP W/TINT 26ML (MISCELLANEOUS) ×2 IMPLANT
CLAMP CORD UMBIL (MISCELLANEOUS) IMPLANT
CLOSURE STERI STRIP 1/2 X4 (GAUZE/BANDAGES/DRESSINGS) ×2 IMPLANT
CLOTH BEACON ORANGE TIMEOUT ST (SAFETY) ×2 IMPLANT
DECANTER SPIKE VIAL GLASS SM (MISCELLANEOUS) ×2 IMPLANT
DRSG OPSITE POSTOP 4X10 (GAUZE/BANDAGES/DRESSINGS) ×2 IMPLANT
ELECT REM PT RETURN 9FT ADLT (ELECTROSURGICAL) ×2
ELECTRODE REM PT RTRN 9FT ADLT (ELECTROSURGICAL) ×1 IMPLANT
EXTRACTOR VACUUM BELL STYLE (SUCTIONS) IMPLANT
GLOVE BIOGEL PI IND STRL 6.5 (GLOVE) ×1 IMPLANT
GLOVE BIOGEL PI IND STRL 7.0 (GLOVE) ×2 IMPLANT
GLOVE BIOGEL PI INDICATOR 6.5 (GLOVE) ×1
GLOVE BIOGEL PI INDICATOR 7.0 (GLOVE) ×2
GLOVE ECLIPSE 6.0 STRL STRAW (GLOVE) ×2 IMPLANT
GLOVE ORTHOPEDIC STR SZ6.5 (GLOVE) ×2 IMPLANT
GOWN STRL REUS W/TWL LRG LVL3 (GOWN DISPOSABLE) ×6 IMPLANT
HEMOSTAT ARISTA ABSORB 3G PWDR (HEMOSTASIS) ×2 IMPLANT
KIT ABG SYR 3ML LUER SLIP (SYRINGE) IMPLANT
NEEDLE HYPO 22GX1.5 SAFETY (NEEDLE) ×2 IMPLANT
NEEDLE HYPO 25X1 1.5 SAFETY (NEEDLE) IMPLANT
NS IRRIG 1000ML POUR BTL (IV SOLUTION) ×2 IMPLANT
PACK C SECTION WH (CUSTOM PROCEDURE TRAY) ×2 IMPLANT
PAD OB MATERNITY 4.3X12.25 (PERSONAL CARE ITEMS) ×2 IMPLANT
PENCIL SMOKE EVAC W/HOLSTER (ELECTROSURGICAL) ×2 IMPLANT
RETRACTOR WND ALEXIS 25 LRG (MISCELLANEOUS) ×1 IMPLANT
RTRCTR WOUND ALEXIS 25CM LRG (MISCELLANEOUS) ×2
SPONGE LAP 18X18 X RAY DECT (DISPOSABLE) ×4 IMPLANT
STRIP CLOSURE SKIN 1/2X4 (GAUZE/BANDAGES/DRESSINGS) IMPLANT
SUT MON AB 4-0 PS1 27 (SUTURE) ×2 IMPLANT
SUT PDS AB 0 CTX 60 (SUTURE) ×2 IMPLANT
SUT PLAIN 2 0 (SUTURE) ×1
SUT PLAIN ABS 2-0 CT1 27XMFL (SUTURE) ×1 IMPLANT
SUT VIC AB 0 CT1 36 (SUTURE) ×4 IMPLANT
SUT VIC AB 0 CTX 36 (SUTURE) ×1
SUT VIC AB 0 CTX36XBRD ANBCTRL (SUTURE) ×1 IMPLANT
SYR CONTROL 10ML LL (SYRINGE) ×2 IMPLANT
TOWEL OR 17X24 6PK STRL BLUE (TOWEL DISPOSABLE) ×2 IMPLANT
TRAY FOLEY W/BAG SLVR 14FR LF (SET/KITS/TRAYS/PACK) ×2 IMPLANT
WATER STERILE IRR 1000ML POUR (IV SOLUTION) ×2 IMPLANT

## 2018-08-25 NOTE — Anesthesia Preprocedure Evaluation (Addendum)
Anesthesia Evaluation  Patient identified by MRN, date of birth, ID band Patient awake    Reviewed: Allergy & Precautions, NPO status , Patient's Chart, lab work & pertinent test results  Airway Mallampati: II  TM Distance: >3 FB Neck ROM: Full    Dental no notable dental hx. (+) Teeth Intact   Pulmonary neg pulmonary ROS,    Pulmonary exam normal breath sounds clear to auscultation       Cardiovascular hypertension, Normal cardiovascular exam Rhythm:Regular Rate:Normal  Hx/o pre eclampsia with prior C/Section   Neuro/Psych negative neurological ROS  negative psych ROS   GI/Hepatic Neg liver ROS, GERD  Medicated and Controlled,  Endo/Other  Morbid obesity  Renal/GU negative Renal ROS  negative genitourinary   Musculoskeletal   Abdominal (+) + obese,   Peds  Hematology  (+) anemia ,   Anesthesia Other Findings   Reproductive/Obstetrics (+) Pregnancy Previous C/Section x 2 39 weeks                             Anesthesia Physical Anesthesia Plan  ASA: III  Anesthesia Plan: Spinal   Post-op Pain Management:    Induction:   PONV Risk Score and Plan: 4 or greater and Ondansetron, Scopolamine patch - Pre-op and Treatment may vary due to age or medical condition  Airway Management Planned: Natural Airway  Additional Equipment:   Intra-op Plan:   Post-operative Plan:   Informed Consent: I have reviewed the patients History and Physical, chart, labs and discussed the procedure including the risks, benefits and alternatives for the proposed anesthesia with the patient or authorized representative who has indicated his/her understanding and acceptance.     Dental advisory given  Plan Discussed with: CRNA and Surgeon  Anesthesia Plan Comments:        Anesthesia Quick Evaluation

## 2018-08-25 NOTE — Transfer of Care (Signed)
Immediate Anesthesia Transfer of Care Note  Patient: Catherine Cochran  Procedure(s) Performed: CESAREAN SECTION (N/A )  Patient Location: PACU  Anesthesia Type:Spinal  Level of Consciousness: awake, alert  and oriented  Airway & Oxygen Therapy: Patient Spontanous Breathing  Post-op Assessment: Report given to RN and Post -op Vital signs reviewed and stable  Post vital signs: Reviewed and stable  Last Vitals:  Vitals Value Taken Time  BP 111/93 08/25/2018  1:32 PM  Temp    Pulse 88 08/25/2018  1:38 PM  Resp 15 08/25/2018  1:38 PM  SpO2 100 % 08/25/2018  1:38 PM  Vitals shown include unvalidated device data.  Last Pain:  Vitals:   08/25/18 0819  TempSrc: Oral  PainSc: 0-No pain         Complications: No apparent anesthesia complications

## 2018-08-25 NOTE — Op Note (Addendum)
Cesarean Section Operative Report  PATIENT: Catherine Cochran  PROCEDURE DATE: 08/25/2018  PREOPERATIVE DIAGNOSES: Intrauterine pregnancy at 6768w1d weeks gestation; prior CS x2  POSTOPERATIVE DIAGNOSES: The same  PROCEDURE: Repeat Low Transverse Cesarean Section and Liletta IUD placement  SURGEON:   Surgeon(s) and Role:    * Conan Bowensavis, Vernadine Coombs M, MD - Primary    * Arvilla MarketWallace, Catherine Lauren, DO - Assisting - OB Fellow   INDICATIONS: Catherine Mawasia Everett is a 23 y.o. G3P3003 at 368w1d here for cesarean section secondary to the indications listed under preoperative diagnoses; please see preoperative note for further details.  The risks of cesarean section were discussed with the patient including but were not limited to: bleeding which may require transfusion or reoperation; infection which may require antibiotics; injury to bowel, bladder, ureters or other surrounding organs; injury to the fetus; need for additional procedures including hysterectomy in the event of a life-threatening hemorrhage; placental abnormalities wth subsequent pregnancies, incisional problems, thromboembolic phenomenon and other postoperative/anesthesia complications.   The patient concurred with the proposed plan, giving informed written consent for the procedure.  Reviewed risks of IUD placement including perforation, expulsion, retained IUD, side effects, infection, risk of ectopic pregnancy. She verbalizes understanding and desires to proceed with Liletta placement.  FINDINGS:  Viable female infant in cephalic presentation.  Apgars 9 and 9. Weight: 2900g. Clear amniotic fluid.  Intact placenta, three vessel cord.  Normal uterus, fallopian tubes and ovaries bilaterally. Large omental adhesion to anterior abdominal wall noted.   ANESTHESIA: Spinal INTRAVENOUS FLUIDS: 1600 mL  ESTIMATED BLOOD LOSS: 736 mL URINE OUTPUT:  625 ml SPECIMENS: Placenta sent to L&D COMPLICATIONS: None immediate  PROCEDURE IN DETAIL:  The patient preoperatively  received intravenous antibiotics and had sequential compression devices applied to her lower extremities.  She was then taken to the operating room where spinal anesthesia was administered and was found to be adequate. She was then placed in a dorsal supine position with a leftward tilt, and prepped and draped in a sterile manner.  A foley catheter was placed into her bladder and attached to constant gravity.    After an adequate timeout was performed, a Pfannenstiel skin incision was made with scalpel over her preexisting scar and carried through to the underlying layer of fascia. The fascia was incised in the midline, and this incision was extended bilaterally using the Mayo scissors.  Kocher clamps were applied to the superior aspect of the fascial incision and the underlying rectus muscles were dissected off bluntly.  A similar process was carried out on the inferior aspect of the fascial incision. The rectus muscles were separated in the midline bluntly and the peritoneum was entered bluntly. Attention was turned to the lower uterine segment where a low transverse hysterotomy was made with a scalpel and extended bilaterally bluntly.  The infant was successfully delivered, the cord was clamped and cut after one minute, and the infant was handed over to the awaiting neonatology team. Uterine massage was then administered, and the placenta delivered intact with a three-vessel cord. The uterus was then cleared of clots and debris. The hysterotomy was closed with 0 Vicryl in a running locked fashion. Once the hysterotomy closure was completed approximately halfway across, the [place] IUD was removed from the packaging in a sterile fashion. The strings were trimmed and the IUD was removed from the inserter. It was placed at the fundus using a ring forceps and the strings were tucked towards the cervix. The hysterotomy closure was completed, taking care not to  incorporate the IUD strings into the hysterotomy. A  second, imbricating layer was also placed with 0 Vicryl.  Figure-of-eight 0 Vicryl serosal stitches were placed to help with hemostasis.  The pelvis was cleared of all clot and debris. Arista was placed for additional hemostasis. Hemostasis was confirmed on all surfaces. The fascia was then closed using 0 PDS in a running fashion.  The subcutaneous layer was irrigated, then reapproximated with 2-0 plain gut running stitches, and the skin was closed with a 4-0 Vicryl subcuticular stitch.  30 ml of 0.5% Marcaine was injected subcutaneously around the incision.  The patient tolerated the procedure well. Sponge, lap, instrument and needle counts were correct x 3.  She was taken to the recovery room in stable condition.   An experienced assistant was required given the standard of surgical care given the complexity of the case.  This assistant was needed for exposure, dissection, suctioning, retraction, instrument exchange, assisting with delivery with administration of fundal pressure, and for overall help during the procedure.   Maternal Disposition: PACU - hemodynamically stable.   Infant Disposition: stable   Phill Myron, D.O. OB Fellow  08/25/2018, 3:19 PM

## 2018-08-25 NOTE — Lactation Note (Signed)
This note was copied from a baby's chart. Lactation Consultation Note  Patient Name: Girl Camya Haydon [id number] Date: 08/25/2018   2230 - I followed up with Ms. Prudencio Burly RN regarding her feeding plan. Her RN states that Ms. Bechard has decided to formula feed at this time. I recommended that if Ms. Bessent changes her mind that she contact lactation for support.    Lenore Manner 08/25/2018, 11:32 PM

## 2018-08-25 NOTE — Anesthesia Procedure Notes (Signed)
Spinal  Patient location during procedure: OR Start time: 08/25/2018 12:03 PM End time: 08/25/2018 12:09 PM Staffing Anesthesiologist: Josephine Igo, MD Performed: anesthesiologist  Preanesthetic Checklist Completed: patient identified, site marked, surgical consent, pre-op evaluation, timeout performed, IV checked, risks and benefits discussed and monitors and equipment checked Spinal Block Patient position: sitting Prep: site prepped and draped and DuraPrep Patient monitoring: heart rate, cardiac monitor, continuous pulse ox and blood pressure Approach: midline Location: L3-4 Injection technique: single-shot Needle Needle type: Pencan  Needle gauge: 24 G Needle length: 9 cm Needle insertion depth: 7 cm Assessment Sensory level: T4 Additional Notes Patient tolerated procedure well. Adequate sensory level.

## 2018-08-25 NOTE — Anesthesia Postprocedure Evaluation (Signed)
Anesthesia Post Note  Patient: Catherine Cochran  Procedure(s) Performed: CESAREAN SECTION (N/A )     Patient location during evaluation: PACU Anesthesia Type: Spinal Level of consciousness: oriented and awake and alert Pain management: pain level controlled Vital Signs Assessment: post-procedure vital signs reviewed and stable Respiratory status: spontaneous breathing, respiratory function stable and nonlabored ventilation Cardiovascular status: blood pressure returned to baseline and stable Postop Assessment: no headache, no backache, no apparent nausea or vomiting, spinal receding and patient able to bend at knees Anesthetic complications: no    Last Vitals:  Vitals:   08/25/18 1418 08/25/18 1419  BP:    Pulse: 93 78  Resp: 15 13  Temp:    SpO2: 100% 99%    Last Pain:  Vitals:   08/25/18 1414  TempSrc:   PainSc: 3    Pain Goal:    LLE Motor Response: No movement due to regional block (08/25/18 1400) LLE Sensation: No sensation (absent) (08/25/18 1400) RLE Motor Response: No movement due to regional block (08/25/18 1400) RLE Sensation: No sensation (absent) (08/25/18 1400)     Epidural/Spinal Function Cutaneous sensation: No Sensation (08/25/18 1400), Patient able to flex knees: No (08/25/18 1400), Patient able to lift hips off bed: No (08/25/18 1400), Back pain beyond tenderness at insertion site: No (08/25/18 1400), Progressively worsening motor and/or sensory loss: No (08/25/18 1400), Bowel and/or bladder incontinence post epidural: No (08/25/18 1400)  [name] A.

## 2018-08-25 NOTE — H&P (Signed)
Obstetric Preoperative History and Physical  Catherine Cochran is a 23 y.o. [id number] with IUP at 100w1d presenting for scheduled cesarean section.  No acute concerns.   Prenatal Course Source of Care: [facility] with onset of care at 18 weeks Pregnancy complications or risks: Patient Active Problem List   Diagnosis Date Noted  . H/O: C-section 08/25/2018  . GBS (group B Streptococcus carrier), +RV culture, currently pregnant 08/18/2018  . Anemia in pregnancy 06/02/2018  . Obesity in pregnancy 04/03/2018  . 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 Supervision of high risk pregnancy, antepartum 03/21/2018  . History of gestational hypertension 03/21/2018  . History of 2 cesarean sections 03/21/2018   She plans to breastfeed She desires IUD for postpartum contraception.   Prenatal labs and studies: ABO, Rh: --/--/PENDING (06/08 0805) Antibody: PENDING (06/08 0805) Rubella: 5.09 (01/03 1127) RPR: Non Reactive (03/12 0911)  HBsAg: Negative (01/03 1127)  HIV: Non Reactive (03/12 0911)  GBS:  2 hr Glucola  normal Genetic screening normal Anatomy US normal  Prenatal Transfer Tool  Fetal Ultrasounds or other Referrals:  None Maternal Substance Abuse:  No Significant Maternal Medications:  None Significant Maternal Lab Results: None  Past Medical History:  Diagnosis Date  . Anemia   . Hypertension    Gestational HTN 2018    Past Surgical History:  Procedure Laterality Date  . CESAREAN SECTION  2017, 2018  . TONSILLECTOMY     2nd grade  . WISDOM TOOTH EXTRACTION      OB History  Gravida Para Term Preterm AB Living  3 2 2     2   SAB TAB Ectopic Multiple Live Births          2    # Outcome Date GA Lbr Len/2nd Weight Sex Delivery Anes PTL Lv  3 Current           2 Term 05/24/17   2637 g F CS-LTranv EPI  LIV     Birth Comments: 2 vessel cord     Complications: Gestational hypertension  1 Term 02/28/16   3005 g M CS-LTranv EPI N LIV     Complications: Fetal  Intolerance    Social History   Socioeconomic History  . Marital status: Single    Spouse name: Not on file  . Number of children: 2  . Years of education: 32  . Highest education level: High school graduate  Occupational History  . Occupation: Surveyor, quantity: [employer]  . Financial resource strain: Not hard at all  . Food insecurity:    Worry: Never true    Inability: Never true  . Transportation needs:    Medical: No    Non-medical: Not on file  Tobacco Use  . Smoking status: Never Smoker  . Smokeless tobacco: Never Used  Substance and Sexual Activity  . Alcohol use: Not Currently  . Drug use: Never  . Sexual activity: Yes  Lifestyle  . Physical activity:    Days per week: Not on file    Minutes per session: Not on file  . Stress: Only a little  Relationships  . Social connections:    Talks on phone: Not on file    Gets together: Not on file    Attends religious service: Not on file    Active member of club or organization: Not on file    Attends meetings of clubs or organizations: Not on file    Relationship status: Not on file  Other Topics Concern  . Not on file  Social History Narrative  . Not on file    Family History  Problem Relation Age of Onset  . Diabetes Mother   . HIV Mother   . Asthma Mother   . Asthma Brother     Medications Prior to Admission  Medication Sig Dispense Refill Last Dose  . acetaminophen (TYLENOL) 500 MG tablet Take 500-1,000 mg by mouth every 6 (six) hours as needed for moderate pain or headache.   08/24/2018 at Unknown time  . aspirin EC 81 MG tablet Take 81 mg by mouth daily.   08/24/2018 at Unknown time  . docusate sodium (COLACE) 100 MG capsule Take 1 capsule (100 mg total) by mouth 2 (two) times daily as needed. (Patient taking differently: Take 100 mg by mouth 2 (two) times daily as needed for mild constipation. ) 60 capsule 3 08/24/2018 at Unknown time  . ferrous gluconate (FERGON) 324 MG tablet Take 1  tablet (324 mg total) by mouth 2 (two) times daily with a meal. 60 tablet 2 08/24/2018 at Unknown time  . Prenatal Vit-Fe Fumarate-FA (PRENATAL PO) Take 2 tablets by mouth daily.   08/24/2018 at Unknown time    No Known Allergies  Review of Systems: Negative except for what is mentioned in HPI.  Physical Exam: BP 129/73   Pulse 89   Temp 98.6 F (37 C) (Oral)   Resp 17   Ht 5\' 6"  (1.676 m)   Wt 132.5 kg   LMP 11/24/2017 (Exact Date)   BMI 47.13 kg/m  FHR by Doppler: 153 bpm CONSTITUTIONAL: Well-developed, well-nourished female in no acute distress.  HENT:  Normocephalic, atraumatic, External right and left ear normal. Oropharynx is clear and moist EYES: Conjunctivae and EOM are normal. Pupils are equal, round, and reactive to light. No scleral icterus.  NECK: Normal range of motion, supple, no masses SKIN: Skin is warm and dry. No rash noted. Not diaphoretic. No erythema. No pallor. NEUROLGIC: Alert and oriented to person, place, and time. Normal reflexes, muscle tone coordination. No cranial nerve deficit noted. PSYCHIATRIC: Normal mood and affect. Normal behavior. Normal judgment and thought content. CARDIOVASCULAR: Normal heart rate noted RESPIRATORY: Effort normal, no problems with respiration noted ABDOMEN: Soft, nontender, nondistended, gravid. Well-healed Pfannenstiel incision. PELVIC: Deferred MUSCULOSKELETAL: Normal range of motion. No edema and no tenderness. 2+ distal pulses.   Pertinent Labs/Studies:   Results for orders placed or performed during the hospital encounter of 08/25/18 (from the past 72 hour(s))  Type and screen [facility]     Status: None (Preliminary result)   Collection Time: 08/25/18  8:05 AM  Result Value Ref Range   ABO/RH(D) PENDING    Antibody Screen PENDING    Sample Expiration      08/28/2018,2359 Performed at [facility], [address] [place], [place] [address]   CBC     Status: Abnormal   Collection Time:  08/25/18  8:10 AM  Result Value Ref Range   WBC 12.6 (H) 4.0 - 10.5 K/uL   RBC 3.73 (L) 3.87 - 5.11 MIL/uL   Hemoglobin 9.6 (L) 12.0 - 15.0 g/dL   HCT 60.431.7 (L) 54.036.0 - 98.146.0 %   MCV 85.0 80.0 - 100.0 fL   MCH 25.7 (L) 26.0 - 34.0 pg   MCHC 30.3 30.0 - 36.0 g/dL   RDW 19.116.4 (H) 47.811.5 - 29.515.5 %   Platelets 314 150 - 400 K/uL   nRBC 0.0 0.0 -  0.2 %    Comment: Performed at [facility], [address] [place], [place] [address]    Assessment and Plan :Libby Mawasia Forsman is a 23 y.o. G3P2002 at 6822w1d being admitted for scheduled repeat cesarean section and Liletta IUD placement. No acute issues today  The risks of cesarean section were discussed with the patient; including but not limited to: infection which may require antibiotics; bleeding which may require transfusion or re-operation; injury to bowel, bladder, ureters or other surrounding organs; need for additional procedures including hysterectomy in the event of a life-threatening hemorrhage; placental abnormalities wth subsequent pregnancies, risk of needing c-sections in future pregnancies, incisional problems, thromboembolic phenomenon and other postoperative/anesthesia complications. Answered all questions. The patient verbalized understanding of the plan, giving informed consent for the procedure. She is agreeable to blood transfusion in the event of emergency.  She was counseled regarding the risks/benefits of IUD including insertion risk of infection, hemorrhage, damage to surrounding tissue and organs. She was counseled regarding risks of IUD, including implantation into uterine wall, migration outside of uterus, possible need for hysteroscopic or laparoscopic removal, expulsion. Reviewed that she is also at slightly higher risk for ectopic pregnancy and she should take a pregnancy test if she believes she may be pregnant. She was advised to use backup method of protection for one week. She verbalized understanding of all of the above and  consent signed.    Patient has been NPO since last night, she will remain NPO for procedure Anesthesia and OR aware Preoperative prophylactic antibiotics and SCDs ordered on call to the OR  To OR when ready For repeat c-section and Liletta IUD placement  K. Therese SarahMeryl Ara Mano, M.D. Attending Obstetrician & Gynecologist, [facility], [facility]

## 2018-08-25 NOTE — Discharge Summary (Addendum)
OB Discharge Summary     Patient Name: Catherine Cochran DOB: 02-May-1995 MRN: [id number]  Date of admission: 08/25/2018 Delivering MD: Conan BowensAVIS, KELLY M   Date of discharge: 08/27/2018  Admitting diagnosis: RCS Intrauterine pregnancy: 2994w1d     Secondary diagnosis:  Active Problems:   Obesity in pregnancy   Anemia in pregnancy   H/O: C-section   Status post repeat low transverse cesarean section   IUD (intrauterine device) in place  Additional problems: none     Discharge diagnosis: Term Pregnancy Delivered                                                                                                Post partum procedures:postpartum IUD  Augmentation: None   Complications: None  Hospital course:  Sceduled C/S   23 y.o. yo G3P3003 at 5494w1d was admitted to the hospital 08/25/2018 for scheduled cesarean section with the following indication:Elective Repeat.  Membrane Rupture Time/Date: 12:32 PM ,08/25/2018   Patient delivered a Viable infant.08/25/2018  Details of operation can be found in separate operative note.  Pateint had an uncomplicated postpartum course.  She is ambulating, tolerating a regular diet, passing flatus, and urinating well. Patient is discharged home in stable condition on  08/27/18         Physical exam  Vitals:   08/26/18 0936 08/26/18 1446 08/26/18 2135 08/27/18 0611  BP: 130/78 130/69 125/86 111/79  Pulse: 76 82 73 66  Resp: 16 18 17 16   Temp: 98.2 F (36.8 C) 98.2 F (36.8 C) 98.2 F (36.8 C) 97.6 F (36.4 C)  TempSrc: Oral Oral Oral Oral  SpO2: 100% 97% 98% 100%  Weight:      Height:       General: alert, cooperative and no distress Lochia: appropriate Uterine Fundus: firm Incision: Healing well with no significant drainage, No significant erythema, honey comb with dark red blood, no fresh blood, and without purulent drainage. DVT Evaluation: No evidence of DVT seen on physical exam. Negative Homan's sign. No cords or calf tenderness. Labs: Lab  Results  Component Value Date   WBC 11.0 (H) 08/26/2018   HGB 7.8 (L) 08/27/2018   HCT 25.4 (L) 08/27/2018   MCV 85.4 08/26/2018   PLT 273 08/26/2018   CMP Latest Ref Rng & Units 08/26/2018  Creatinine 0.44 - 1.00 mg/dL 4.090.55    Discharge instruction: per After Visit Summary and "Baby and Me Booklet".  After visit meds:  Allergies as of 08/27/2018   No Known Allergies     Medication List    TAKE these medications   acetaminophen 500 MG tablet Commonly known as:  TYLENOL Take 500-1,000 mg by mouth every 6 (six) hours as needed for moderate pain or headache.   aspirin EC 81 MG tablet Take 81 mg by mouth daily.   docusate sodium 100 MG capsule Commonly known as:  COLACE Take 1 capsule (100 mg total) by mouth 2 (two) times daily as needed. What changed:  reasons to take this   ferrous gluconate 324 MG tablet Commonly known as:  FERGON Take 1 tablet (324 mg  total) by mouth 2 (two) times daily with a meal.   PRENATAL PO Take 2 tablets by mouth daily.       Diet: routine diet  Activity: Advance as tolerated. Pelvic rest for 6 weeks.   Outpatient follow up:2 weeks  Change dressing prior to discharge.  Please schedule this patient for Postpartum visit in: 4 weeks with the following provider: Any provider For C/S patients schedule nurse incision check in weeks 2 weeks: yes Low risk pregnancy complicated by: none  Delivery mode:  CS Anticipated Birth Control:  IUD placed at time of CS  PP Procedures needed: Incision check, IUD string check  Schedule Integrated [place] visit: no Follow up Appt: Future Appointments  Date Time Provider [place]  09/08/2018  9:20 AM [place] [facility]  09/22/2018  2:15 PM Emily Filbert, MD [facility]   Follow up Visit:No follow-ups on file.  Postpartum contraception: IUD Liletta in place   Newborn Data: Live born female  Birth Weight: 6 lb 6.3 oz (2900 g) APGAR: 9, 9  Newborn Delivery   Birth date/time:  08/25/2018  12:33:00 Delivery type:  C-Section, Low Transverse Trial of labor:  No C-section categorization:  Repeat     Baby Feeding: Breast Disposition:Home   08/27/2018 Wilber Oliphant, MD  OB FELLOW DISCHARGE ATTESTATION  I have seen and examined this patient and agree with above documentation in the resident's note.   Phill Myron, D.O. OB Fellow  08/27/2018, 3:15 PM

## 2018-08-26 DIAGNOSIS — O34211 Maternal care for low transverse scar from previous cesarean delivery: Secondary | ICD-10-CM

## 2018-08-26 DIAGNOSIS — O99824 Streptococcus B carrier state complicating childbirth: Secondary | ICD-10-CM

## 2018-08-26 DIAGNOSIS — Z3A39 39 weeks gestation of pregnancy: Secondary | ICD-10-CM

## 2018-08-26 LAB — CBC
HCT: 26.4 % — ABNORMAL LOW (ref 36.0–46.0)
Hemoglobin: 8 g/dL — ABNORMAL LOW (ref 12.0–15.0)
MCH: 25.9 pg — ABNORMAL LOW (ref 26.0–34.0)
MCHC: 30.3 g/dL (ref 30.0–36.0)
MCV: 85.4 fL (ref 80.0–100.0)
Platelets: 273 10*3/uL (ref 150–400)
RBC: 3.09 MIL/uL — ABNORMAL LOW (ref 3.87–5.11)
RDW: 16.5 % — ABNORMAL HIGH (ref 11.5–15.5)
WBC: 11 10*3/uL — ABNORMAL HIGH (ref 4.0–10.5)
nRBC: 0 % (ref 0.0–0.2)

## 2018-08-26 LAB — BIRTH TISSUE RECOVERY COLLECTION (PLACENTA DONATION)

## 2018-08-26 LAB — CREATININE, SERUM
Creatinine, Ser: 0.55 mg/dL (ref 0.44–1.00)
GFR calc Af Amer: >60 mL/min (ref >60)
GFR calc non Af Amer: >60 mL/min (ref >60)

## 2018-08-26 MED ORDER — IBUPROFEN 600 MG PO TABS
600.0000 mg | ORAL_TABLET | Freq: Four times a day (QID) | ORAL | Status: DC
  Administered 2018-08-26 – 2018-08-27 (×5): 600 mg via ORAL
  Filled 2018-08-26 (×5): qty 1

## 2018-08-26 MED ORDER — FERROUS FUMARATE 324 (106 FE) MG PO TABS
1.0000 | ORAL_TABLET | Freq: Two times a day (BID) | ORAL | Status: DC
  Administered 2018-08-26 – 2018-08-27 (×3): 106 mg via ORAL
  Filled 2018-08-26 (×3): qty 1

## 2018-08-26 NOTE — Progress Notes (Signed)
Subjective: Postpartum Day #1: Cesarean Delivery & post placental Liletta plcmt Patient reports tolerating PO and no problems voiding. Denies dizziness or other s/s anemia. Bottlefeeding.   Objective: Vital signs in last 24 hours: Temp:  [97.9 F (36.6 C)-98.6 F (37 C)] 98.4 F (36.9 C) (06/09 0604) Pulse Rate:  [65-107] 91 (06/09 0604) Resp:  [0-42] 14 (06/09 0604) BP: (94-132)/(37-101) 110/75 (06/09 0604) SpO2:  [96 %-100 %] 100 % (06/09 0604) Weight:  [132.5 kg] 132.5 kg (06/08 0819)  Physical Exam:  General: alert, cooperative and no distress Lochia: appropriate Uterine Fundus: firm Incision: honeycomb intact, marked and unchanged DVT Evaluation: No evidence of DVT seen on physical exam.  Recent Labs    08/25/18 0810 08/26/18 0547  HGB 9.6* 8.0*  HCT 31.7* 26.4*    Assessment/Plan: Status post Cesarean section. Doing well postoperatively.  Continue current care. Will start po Fe. Anticipate d/c on 08/27/18.  Myrtis Ser CNM 08/26/2018, 8:15 AM

## 2018-08-26 NOTE — Clinical Social Work Maternal (Addendum)
CLINICAL SOCIAL WORK MATERNAL/CHILD NOTE  Patient Details  Name: Jeanella Craze MRN: [id number] Date of Birth: 02/28/1996  Date:  07/22/18  Clinical Social Worker Initiating Note:  Elijio Miles Date/Time: Initiated:  08/26/18/0854     Child's Name:  Charlesetta Garibaldi   Biological Parents:  Mother, Father(Krisann Prudencio Burly and Martinique Stinson DOB: 07/29/1997)   Need for Interpreter:  None   Reason for Referral:  Competency/Guardianship (Suspected that MOB does not have custody of oldest child)   Address:  [place] [address]    Phone number:  [phone] (home)     Additional phone number:   Household Members/Support Persons (HM/SP):   Household Member/Support Person 1, Household Member/Support Person 2, Household Member/Support Person 3, Household Member/Support Person 4   HM/SP Name Relationship DOB or Age  HM/SP -1 Martinique Stinson FOB 07/29/1997  HM/SP -2   FOB's mother    HM/SP -3   FOB's mother's boyfriend    HM/SP -63   FOB's brother 18+  HM/SP -5        HM/SP -6        HM/SP -7        HM/SP -8          Natural Supports (not living in the home):  (MOB reported FOB's family is very supportive)   Chiropodist: None   Employment: Unemployed   Type of Work:     Education:  Programmer, systems   Homebound arranged:    Museum/gallery curator Resources:  [employer]   Other Resources:  [employer], Physicist, medical    Cultural/Religious Considerations Which May Impact Care:    Strengths:  Ability to meet basic needs , Home prepared for child    Psychotropic Medications:         Pediatrician:       Pediatrician List:   [facility]      Pediatrician Fax Number:    Risk Factors/Current Problems:      Cognitive State:  Able to Concentrate , Alert    Mood/Affect:  Calm , Comfortable , Interested , Relaxed    CSW Assessment: CSW received consult for MOB does  not have custody of her oldest child.  CSW met with MOB to offer support and complete assessment.    MOB sitting up in bed with FOB walking around room holding infant, when CSW entered the room. CSW introduced self and received verbal permission from MOB to complete assessment with FOB present. CSW explained reason for consult and MOB and FOB expressed understanding. MOB and FOB appropriate and appeared engaged throughout assessment. FOB did not speak much but was actively bonding with infant during CSW visit. MOB reported she currently resides with FOB, FOB's mother, FOB's mother's boyfriend and FOB's brother. MOB stated this is her third child and reported her daughter Cristopher Peru DOB: 05/24/2017) currently lives with her cousin Chanetta Marshall) and reported her son Carole Civil DOB: 02/28/2016) lives with his father Tobi Bastos) in [place]. MOB denied any CPS involvement with the placement of either child and stated she still has custody of both children. MOB stated they intend to move children in with them once they are more settled as MOB recently moved to [facility] from [place]. MOB denied any CPS history. CSW followed up with [place] who confirmed MOB did not have any open cases. MOB stated she  currently receives WIC and food stamps and is aware of process to get infant added on to her plan.   MOB denied any mental health history or history of PPD with her other children but was receptive to PMADs education. CSW provided education regarding the baby blues period vs. perinatal mood disorders, discussed treatment and gave resources for mental health follow up if concerns arise.  CSW recommends self-evaluation during the postpartum time period using the New Mom Checklist from Postpartum Progress and encouraged MOB to contact a medical professional if symptoms are noted at any time. MOB denied any current SI or HI and reported having good support from FOB's family.   MOB and FOB confirmed having all  essential items for infant once discharged and stated infant would be sleeping in a basinet. CSW provided review of Sudden Infant Death Syndrome (SIDS) precautions and safe sleeping habits and both FOB and MOB expressed understanding. MOB and FOB provided with list of pediatricians to choose from. CSW inquired about if MOB has had any CPS involvement in the past and MOB denied any history. MOB and FOB denied any further questions, concerns, or need for resources from CSW, at this time.   CSW Plan/Description:  No Further Intervention Required/No Barriers to Discharge, Sudden Infant Death Syndrome (SIDS) Education, Perinatal Mood and Anxiety Disorder (PMADs) Education    Irmgard Rampersaud, LCSWA 08/26/2018, 9:31 AM 

## 2018-08-27 ENCOUNTER — Encounter (HOSPITAL_COMMUNITY): Payer: Self-pay | Admitting: Obstetrics and Gynecology

## 2018-08-27 LAB — HEMOGLOBIN AND HEMATOCRIT, BLOOD
HCT: 25.4 % — ABNORMAL LOW (ref 36.0–46.0)
Hemoglobin: 7.8 g/dL — ABNORMAL LOW (ref 12.0–15.0)

## 2018-08-27 MED ORDER — IBUPROFEN 800 MG PO TABS: 800.0000 mg | ORAL_TABLET | Freq: Three times a day (TID) | ORAL | 1 refills | Status: DC | PRN

## 2018-08-27 MED ORDER — SENNOSIDES-DOCUSATE SODIUM 8.6-50 MG PO TABS: 2.0000 | ORAL_TABLET | ORAL | 0 refills | Status: DC

## 2018-08-27 MED ORDER — OXYCODONE HCL 5 MG PO TABS: 5.0000 mg | ORAL_TABLET | Freq: Three times a day (TID) | ORAL | 0 refills | Status: AC | PRN

## 2018-08-27 NOTE — Discharge Instructions (Signed)

## 2018-08-27 NOTE — Progress Notes (Signed)
Plans for discharge today. Will need dressing change prior to discharge.   Zettie Cooley, M.D.  Family Medicine  PGY-1 08/27/2018 8:21 AM

## 2018-08-28 LAB — TYPE AND SCREEN
ABO/RH(D): B POS
Antibody Screen: POSITIVE
Unit division: 0
Unit division: 0

## 2018-08-28 LAB — BPAM RBC
Blood Product Expiration Date: 202006252359
Blood Product Expiration Date: 202006252359
Unit Type and Rh: 7300
Unit Type and Rh: 7300

## 2018-09-05 ENCOUNTER — Telehealth: Payer: Self-pay | Admitting: Family Medicine

## 2018-09-05 NOTE — Telephone Encounter (Signed)
Attempted to call patient about her appointment on 6/22 @ 9:20. No answer left detailed voicemail instructing the patient to wear a face mask for the entire appointment and no visitors are allowed. Patient instructed that if she has any symptoms to not come to the appointment and give the office a call to be rescheduled. Office number and list of symptoms were left.

## 2018-09-08 ENCOUNTER — Other Ambulatory Visit: Payer: Self-pay

## 2018-09-08 ENCOUNTER — Ambulatory Visit (INDEPENDENT_AMBULATORY_CARE_PROVIDER_SITE_OTHER): Payer: Medicaid Other | Admitting: *Deleted

## 2018-09-08 DIAGNOSIS — Z98891 History of uterine scar from previous surgery: Secondary | ICD-10-CM

## 2018-09-08 NOTE — Progress Notes (Signed)
Pt had repeat c/s on 08/25/2018 and is here for incision check. Pt states she and baby are doing well. Incision is clean and dry and edges well approximated. No redness or drainage. Pt has virtual pp appt scheduled for 09/22/18

## 2018-09-09 NOTE — Progress Notes (Signed)
I have reviewed the chart and agree with nursing staff's documentation of this patient's encounter.  Philis Doke, MD 09/09/2018 12:59 PM    

## 2018-09-15 ENCOUNTER — Encounter (HOSPITAL_COMMUNITY): Payer: Self-pay | Admitting: *Deleted

## 2018-09-15 ENCOUNTER — Inpatient Hospital Stay (HOSPITAL_COMMUNITY): Payer: Medicaid Other

## 2018-09-15 ENCOUNTER — Inpatient Hospital Stay (HOSPITAL_COMMUNITY)
Admission: AD | Admit: 2018-09-15 | Discharge: 2018-09-15 | Disposition: A | Discharge status: Home / Self Care | Payer: Medicaid Other | Attending: Obstetrics and Gynecology | Admitting: Obstetrics and Gynecology

## 2018-09-15 ENCOUNTER — Other Ambulatory Visit: Payer: Self-pay

## 2018-09-15 DIAGNOSIS — O9089 Other complications of the puerperium, not elsewhere classified: Secondary | ICD-10-CM | POA: Insufficient documentation

## 2018-09-15 DIAGNOSIS — R102 Pelvic and perineal pain: Secondary | ICD-10-CM | POA: Diagnosis not present

## 2018-09-15 DIAGNOSIS — Z98891 History of uterine scar from previous surgery: Secondary | ICD-10-CM

## 2018-09-15 DIAGNOSIS — Z7982 Long term (current) use of aspirin: Secondary | ICD-10-CM | POA: Insufficient documentation

## 2018-09-15 DIAGNOSIS — R52 Pain, unspecified: Secondary | ICD-10-CM

## 2018-09-15 DIAGNOSIS — T8332XA Displacement of intrauterine contraceptive device, initial encounter: Secondary | ICD-10-CM

## 2018-09-15 DIAGNOSIS — G8918 Other acute postprocedural pain: Secondary | ICD-10-CM | POA: Diagnosis not present

## 2018-09-15 LAB — CBC
HCT: 33.2 % — ABNORMAL LOW (ref 36.0–46.0)
Hemoglobin: 9.8 g/dL — ABNORMAL LOW (ref 12.0–15.0)
MCH: 25.1 pg — ABNORMAL LOW (ref 26.0–34.0)
MCHC: 29.5 g/dL — ABNORMAL LOW (ref 30.0–36.0)
MCV: 84.9 fL (ref 80.0–100.0)
Platelets: 444 10*3/uL — ABNORMAL HIGH (ref 150–400)
RBC: 3.91 MIL/uL (ref 3.87–5.11)
RDW: 16.5 % — ABNORMAL HIGH (ref 11.5–15.5)
WBC: 11.4 10*3/uL — ABNORMAL HIGH (ref 4.0–10.5)
nRBC: 0 % (ref 0.0–0.2)

## 2018-09-15 LAB — URINALYSIS, ROUTINE W REFLEX MICROSCOPIC
Bilirubin Urine: NEGATIVE
Glucose, UA: NEGATIVE mg/dL
Hgb urine dipstick: NEGATIVE
Ketones, ur: NEGATIVE mg/dL
Leukocytes,Ua: NEGATIVE
Nitrite: NEGATIVE
Protein, ur: NEGATIVE mg/dL
Specific Gravity, Urine: 1.019 (ref 1.005–1.030)
pH: 5 (ref 5.0–8.0)

## 2018-09-15 NOTE — Discharge Instructions (Signed)
Pelvic Pain, Female Pelvic pain is pain in your lower belly (abdomen), below your belly button and between your hips. The pain may start suddenly (be acute), keep coming back (be recurring), or last a long time (become chronic). Pelvic pain that lasts longer than 6 months is called chronic pelvic pain. There are many causes of pelvic pain. Sometimes the cause of pelvic pain is not known. Follow these instructions at home:   Take over-the-counter and prescription medicines only as told by your doctor.  Rest as told by your doctor.  Do not have sex if it hurts.  Keep a journal of your pelvic pain. Write down: ? When the pain started. ? Where the pain is located. ? What seems to make the pain better or worse, such as food or your period (menstrual cycle). ? Any symptoms you have along with the pain.  Keep all follow-up visits as told by your doctor. This is important. Contact a doctor if:  Medicine does not help your pain.  Your pain comes back.  You have new symptoms.  You have unusual discharge or bleeding from your vagina.  You have a fever or chills.  You are having trouble pooping (constipation).  You have blood in your pee (urine) or poop (stool).  Your pee smells bad.  You feel weak or light-headed. Get help right away if:  You have sudden pain that is very bad.  Your pain keeps getting worse.  You have very bad pain and also have any of these symptoms: ? A fever. ? Feeling sick to your stomach (nausea). ? Throwing up (vomiting). ? Being very sweaty.  You pass out (lose consciousness). Summary  Pelvic pain is pain in your lower belly (abdomen), below your belly button and between your hips.  There are many possible causes of pelvic pain.  Keep a journal of your pelvic pain. This information is not intended to replace advice given to you by your health care provider. Make sure you discuss any questions you have with your health care provider. Document  Released: 08/22/2007 Document Revised: 08/21/2017 Document Reviewed: 08/21/2017 Elsevier Patient Education  2020 Elsevier Inc.  

## 2018-09-15 NOTE — MAU Note (Signed)
.   Catherine Cochran is a 23 y.o. at Unknown here in MAU reporting: pelvic pain that started last night. Pt had a cesarean section on June 8th. Denies intercourse after delivery. Tried taking her oxycodone but it did not help with the discomfort  Onset of complaint: 09/14/18 Pain score: 7 Vitals:   09/15/18 1445 09/15/18 1520  BP: 120/79 134/64  Pulse: (!) 113 100  Resp: 16 18  Temp: 98.6 F (37 C) 98.1 F (36.7 C)  SpO2: 98% 100%      Lab orders placed from triage: UA

## 2018-09-15 NOTE — MAU Provider Note (Addendum)
History     CSN: [id number]  Arrival date and time: 09/15/18 1502   First Provider Initiated Contact with Patient 09/15/18 1541      Chief Complaint  Patient presents with  . Pelvic Pain   23 y.o. female s/p repeat CS 3 weeks ago presenting with right sided pelvic pain. Sx started last night. Pain is sharp, intermittent and occurs only with movement, standing, and walking. Rates pain 7/10. She took Oxycodone and Ibuprofen but it didn't help. Denies urinary sx. Lochia is minimal and intermittent. No fevers. No GI sx. Denies sex since giving birth. She had post-placental IUD placed.  OB History    Gravida  3   Para  3   Term  3   Preterm      AB      Living  3     SAB      TAB      Ectopic      Multiple  0   Live Births  3           Past Medical History:  Diagnosis Date  . Anemia   . Hypertension    Gestational HTN 2018    Past Surgical History:  Procedure Laterality Date  . CESAREAN SECTION  2017, 2018  . CESAREAN SECTION N/A 08/25/2018   Procedure: CESAREAN SECTION;  Surgeon: Sloan Leiter, MD;  Location: [facility] LD ORS;  Service: Obstetrics;  Laterality: N/A;  . TONSILLECTOMY     2nd grade  . WISDOM TOOTH EXTRACTION      Family History  Problem Relation Age of Onset  . Diabetes Mother   . HIV Mother   . Asthma Mother   . Asthma Brother     Social History   Tobacco Use  . Smoking status: Never Smoker  . Smokeless tobacco: Never Used  Substance Use Topics  . Alcohol use: Not Currently  . Drug use: Never    Allergies: No Known Allergies  Medications Prior to Admission  Medication Sig Dispense Refill Last Dose  . docusate sodium (COLACE) 100 MG capsule Take 1 capsule (100 mg total) by mouth 2 (two) times daily as needed. (Patient taking differently: Take 100 mg by mouth 2 (two) times daily as needed for mild constipation. ) 60 capsule 3 09/15/2018 at Unknown time  . ferrous gluconate (FERGON) 324 MG tablet Take 1 tablet (324 mg total) by  mouth 2 (two) times daily with a meal. 60 tablet 2 09/15/2018 at Unknown time  . ibuprofen (ADVIL) 800 MG tablet Take 1 tablet (800 mg total) by mouth every 8 (eight) hours as needed. 60 tablet 1 09/15/2018 at Unknown time  . oxyCODONE (ROXICODONE) 5 MG immediate release tablet Take 1 tablet (5 mg total) by mouth every 8 (eight) hours as needed. 21 tablet 0 09/15/2018 at Unknown time  . Prenatal Vit-Fe Fumarate-FA (PRENATAL PO) Take 2 tablets by mouth daily.   09/15/2018 at Unknown time  . senna-docusate (SENOKOT-S) 8.6-50 MG tablet Take 2 tablets by mouth daily. 20 tablet 0 09/15/2018 at Unknown time  . acetaminophen (TYLENOL) 500 MG tablet Take 500-1,000 mg by mouth every 6 (six) hours as needed for moderate pain or headache.     Marland Kitchen aspirin EC 81 MG tablet Take 81 mg by mouth daily.       Review of Systems  Constitutional: Negative for chills and fever.  Gastrointestinal: Negative for abdominal pain, constipation, diarrhea, nausea and vomiting.  Genitourinary: Positive for pelvic pain and vaginal bleeding (  lochia). Negative for dysuria, frequency, hematuria and vaginal discharge.   Physical Exam   Blood pressure 134/64, pulse 100, temperature 98.1 F (36.7 C), resp. rate 18, SpO2 100 %, unknown if currently breastfeeding.  Physical Exam  Nursing note and vitals reviewed. Constitutional: She is oriented to person, place, and time. She appears well-developed and well-nourished. No distress.  HENT:  Head: Normocephalic and atraumatic.  Neck: Normal range of motion.  Respiratory: Effort normal. No respiratory distress.  GI: Soft. She exhibits no distension and no mass. There is no abdominal tenderness. There is no rebound and no guarding.  Low transverse incision-well healed and no erythema, edema, or drainage; skin knot present and trimmed  Genitourinary:    Genitourinary Comments: External: no lesions or erythema Vagina: rugated, pink, moist, scant pink discharge Uterus: + enlarged,  anteverted, non tender, no CMT Adnexae: no masses, no tenderness left, no tenderness right Cervix closed, no strings seen    Musculoskeletal: Normal range of motion.  Neurological: She is alert and oriented to person, place, and time.  Skin: Skin is warm and dry.  Psychiatric: She has a normal mood and affect.   Results for orders placed or performed during the hospital encounter of 09/15/18 (from the past 24 hour(s))  Urinalysis, Routine w reflex microscopic     Status: None   Collection Time: 09/15/18  3:27 PM  Result Value Ref Range   Color, Urine YELLOW YELLOW   APPearance CLEAR CLEAR   Specific Gravity, Urine 1.019 1.005 - 1.030   pH 5.0 5.0 - 8.0   Glucose, UA NEGATIVE NEGATIVE mg/dL   Hgb urine dipstick NEGATIVE NEGATIVE   Bilirubin Urine NEGATIVE NEGATIVE   Ketones, ur NEGATIVE NEGATIVE mg/dL   Protein, ur NEGATIVE NEGATIVE mg/dL   Nitrite NEGATIVE NEGATIVE   Leukocytes,Ua NEGATIVE NEGATIVE  CBC     Status: Abnormal   Collection Time: 09/15/18  4:07 PM  Result Value Ref Range   WBC 11.4 (H) 4.0 - 10.5 K/uL   RBC 3.91 3.87 - 5.11 MIL/uL   Hemoglobin 9.8 (L) 12.0 - 15.0 g/dL   HCT 16.133.2 (L) 09.636.0 - 04.546.0 %   MCV 84.9 80.0 - 100.0 fL   MCH 25.1 (L) 26.0 - 34.0 pg   MCHC 29.5 (L) 30.0 - 36.0 g/dL   RDW 40.916.5 (H) 81.111.5 - 91.415.5 %   Platelets 444 (H) 150 - 400 K/uL   nRBC 0.0 0.0 - 0.2 %   [place] Pelvic Complete With Transvaginal  Result Date: 09/15/2018 CLINICAL DATA:  Pain after Caesarean section.  IUD strings loss. EXAM: TRANSABDOMINAL AND TRANSVAGINAL ULTRASOUND OF PELVIS TECHNIQUE: Both transabdominal and transvaginal ultrasound examinations of the pelvis were performed. Transabdominal technique was performed for global imaging of the pelvis including uterus, ovaries, adnexal regions, and pelvic cul-de-sac. It was necessary to proceed with endovaginal exam following the transabdominal exam to visualize the endometrium. COMPARISON:  None FINDINGS: Uterus Measurements: Anteverted  uterus measures 8.9 x 5.2 x 7.6 cm = volume: 182 mL. No fibroids or other mass visualized. Endometrium Thickness: Normal thickness at 10 mm. Mild heterogeneity to the endometrium. Potential fluid within the canal. IUD in within the endometrial canal. Right ovary Measurements: Normal size at 3.0 x 2.5 x 2.4 cm = volume: 9.4 mL. Normal appearance/no adnexal mass. Left ovary Measurements: Normal size at 2.7 x 1.1 x 1.4 cm = volume: 2.0 mL. Normal appearance/no adnexal mass. Other findings No abnormal free fluid. IMPRESSION: 1. IUD within the endometrial canal. 2. Mild heterogeneity of  endometrium with small amount of fluid the canal. Endometrium is normal thickness. 3. Normal ovaries. Electronically Signed   By: Genevive BiStewart  Edmunds M.D.   On: 09/15/2018 16:47   MAU Course  Procedures  MDM Declines analgesic. Labs and US ordered, and normal. No evidence of infection or malpositioned IUD. Will arrange in-person follow up for pp visit vs virtual visit. Stable for discharge home.  Assessment and Plan   1. Pelvic pain   2. Postpartum pain   3. IUD strings lost   4. Status post repeat low transverse cesarean section    Discharge home Follow up at [facility] next week Return precautions Heat to affected area  Allergies as of 09/15/2018   No Known Allergies     Medication List    TAKE these medications   acetaminophen 500 MG tablet Commonly known as: TYLENOL Take 500-1,000 mg by mouth every 6 (six) hours as needed for moderate pain or headache.   aspirin EC 81 MG tablet Take 81 mg by mouth daily.   docusate sodium 100 MG capsule Commonly known as: COLACE Take 1 capsule (100 mg total) by mouth 2 (two) times daily as needed. What changed: reasons to take this   ferrous gluconate 324 MG tablet Commonly known as: FERGON Take 1 tablet (324 mg total) by mouth 2 (two) times daily with a meal.   ibuprofen 800 MG tablet Commonly known as: ADVIL Take 1 tablet (800 mg total) by mouth every 8 (eight)  hours as needed.   oxyCODONE 5 MG immediate release tablet Commonly known as: Roxicodone Take 1 tablet (5 mg total) by mouth every 8 (eight) hours as needed.   PRENATAL PO Take 2 tablets by mouth daily.   senna-docusate 8.6-50 MG tablet Commonly known as: Senokot-S Take 2 tablets by mouth daily.       Donette LarryMelanie Dyland Panuco, CNM 09/15/2018, 5:22 PM

## 2018-09-22 ENCOUNTER — Telehealth: Payer: Medicaid Other | Admitting: Advanced Practice Midwife

## 2018-09-22 ENCOUNTER — Encounter: Payer: Self-pay | Admitting: Obstetrics & Gynecology

## 2018-09-22 ENCOUNTER — Other Ambulatory Visit: Payer: Self-pay

## 2018-09-22 DIAGNOSIS — Z91199 Patient's noncompliance with other medical treatment and regimen due to unspecified reason: Secondary | ICD-10-CM

## 2018-09-22 DIAGNOSIS — Z5329 Procedure and treatment not carried out because of patient's decision for other reasons: Secondary | ICD-10-CM

## 2018-09-22 NOTE — Progress Notes (Signed)
@  1106am lvm for appointment today. @1114am  LVM to reschedule appointment

## 2018-09-22 NOTE — Progress Notes (Signed)
Patient did not answer for her visit.   Marcille Buffy DNP, CNM  09/22/18  11:44 AM

## 2018-10-07 ENCOUNTER — Encounter (HOSPITAL_COMMUNITY): Payer: Self-pay | Admitting: Obstetrics and Gynecology

## 2018-11-04 ENCOUNTER — Telehealth: Payer: Medicaid Other | Admitting: Family

## 2018-11-04 DIAGNOSIS — Z3009 Encounter for other general counseling and advice on contraception: Secondary | ICD-10-CM

## 2018-11-04 NOTE — Progress Notes (Signed)
Based on what you shared with me, I feel your condition warrants further evaluation and I recommend that you be seen for a face to face office visit.  NOTE: If you entered your credit card information for this eVisit, you will not be charged. You may see a "hold" on your card for the $35 but that hold will drop off and you will not have a charge processed.  If you are having a true medical emergency please call 911.     For an urgent face to face visit, [place] has five urgent care centers for your convenience:   [email or web address] to reserve your spot online an avoid wait times  [address] [place], [place], [place] [address] *Just off [address], across the road from [facility] hours of operation: Monday-Friday, 12 PM to 6 PM  Closed Saturday & Sunday    . [facility]    [phone]                  Get Driving Directions  [address], [place] [address] . 10 am to 8 pm Monday-Friday . 12 pm to 8 pm Saturday-Sunday   . [facility] at [place]                  Get Driving Directions  [address], [place], [place] [address] . 8 am to 8 pm Monday-Friday . 9 am to 6 pm Saturday . 11 am to 6 pm Sunday   . [facility] at [place]                  Get Driving Directions   [address], [place] [address] . 8 am to 8 pm Monday-Friday . 8 am to 4 pm Saturday-Sunday    . [facility] at [place]                    Get Driving Directions  [phone]  [address], [place], [place] [address]  . Monday-Friday, 12 PM to 6 PM    Your e-visit answers were reviewed by a board certified advanced clinical practitioner to complete your personal care plan.  Thank you for using e-Visits.  Greater than 5 minutes, yet less than 10 minutes of time have  been spent researching, coordinating, and implementing care for this patient today.  Thank you for the details you included in the comment boxes. Those details are very helpful in determining the best course of treatment for you and help [place] to provide the best care.

## 2019-05-30 IMAGING — US US MFM OB FOLLOW-UP
1 series · 14 of 19 positions shown · non-contrast
Comparison: none

[Series 1: us mfm ob follow-up · 14 of 19 slices shown]
[im 1/19]
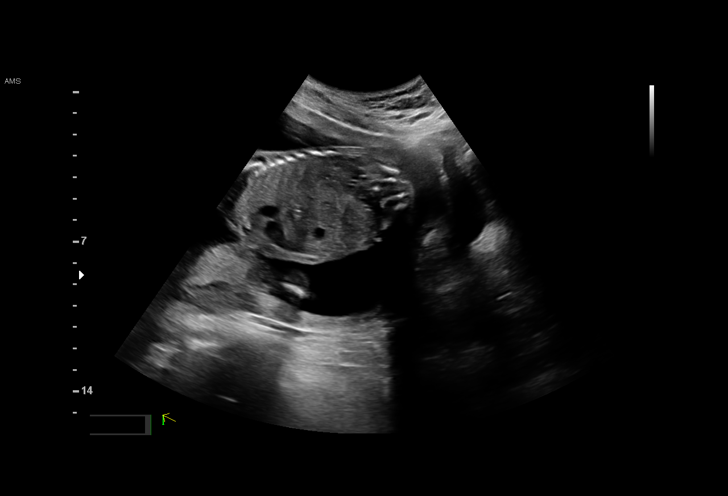
[im 3/19]
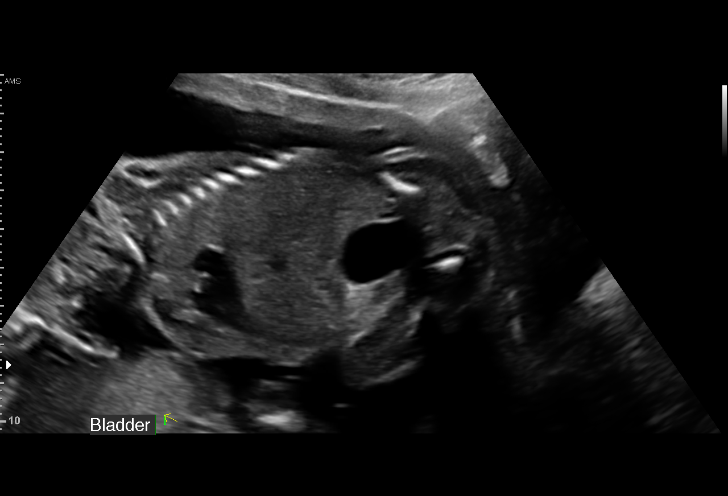
[im 4/19]
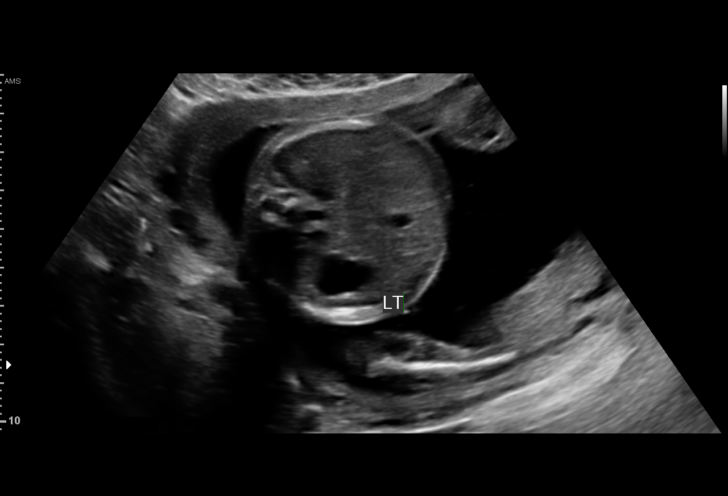
[im 5/19]
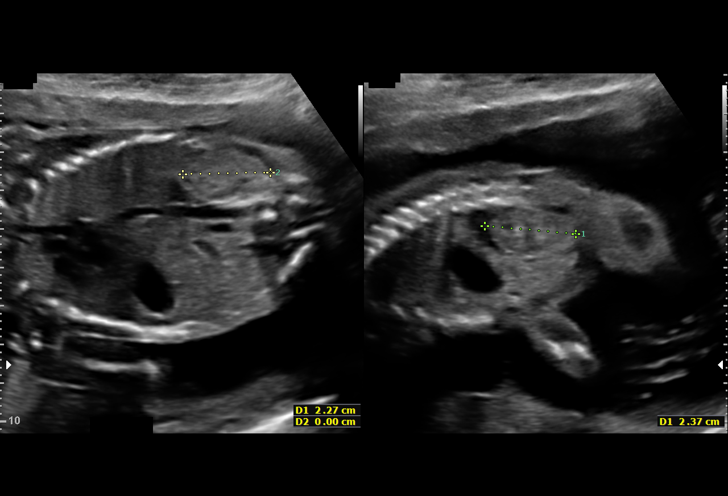
[im 7/19]
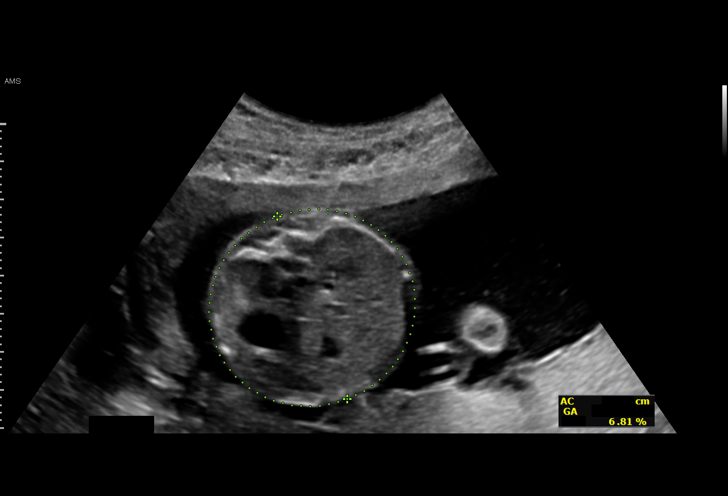
[im 8/19]
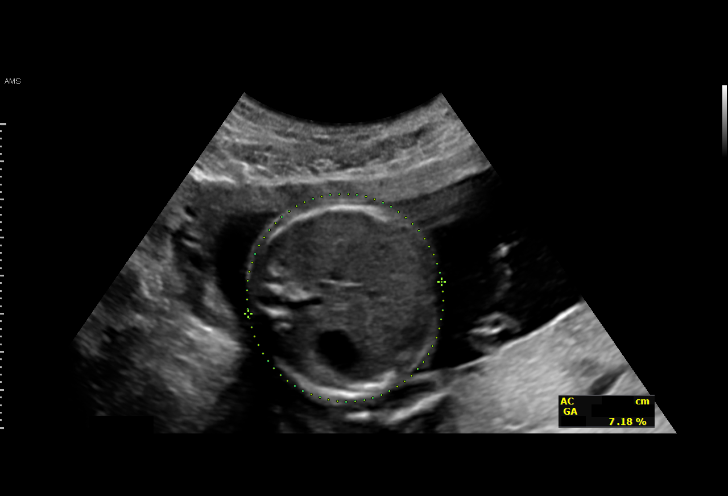
[im 9/19]
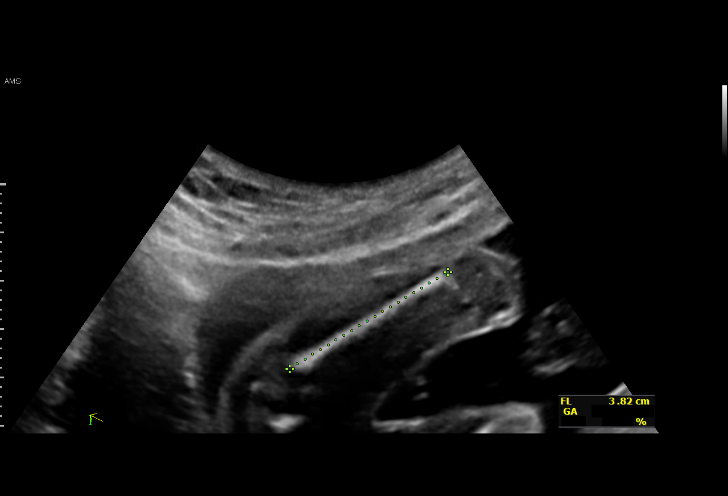
[im 11/19]
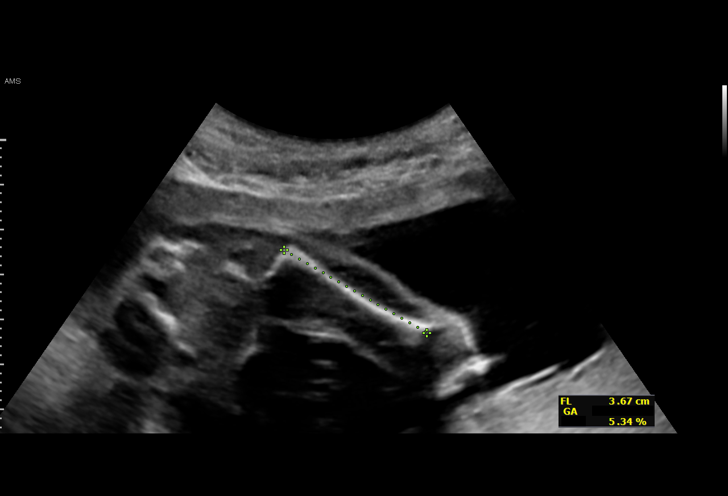
[im 12/19]
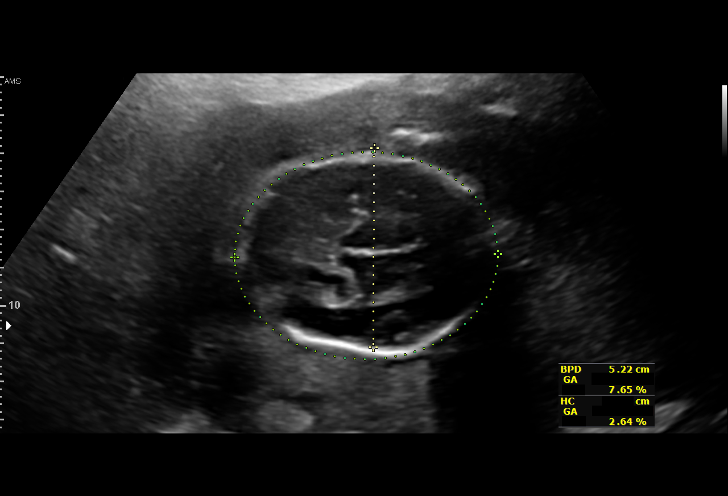
[im 13/19]
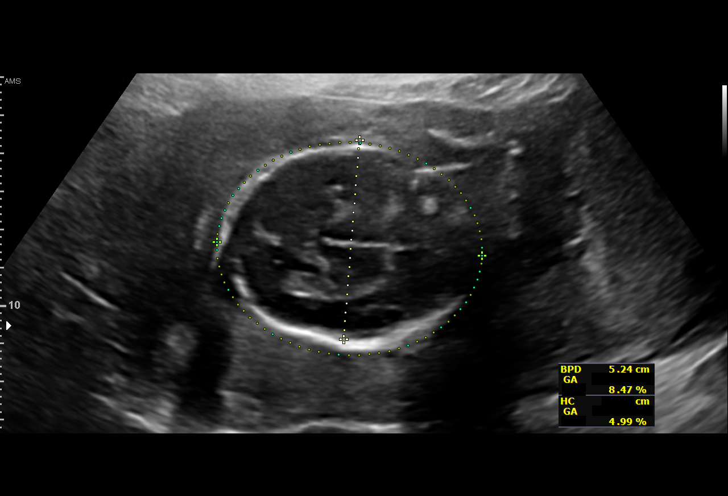
[im 15/19]
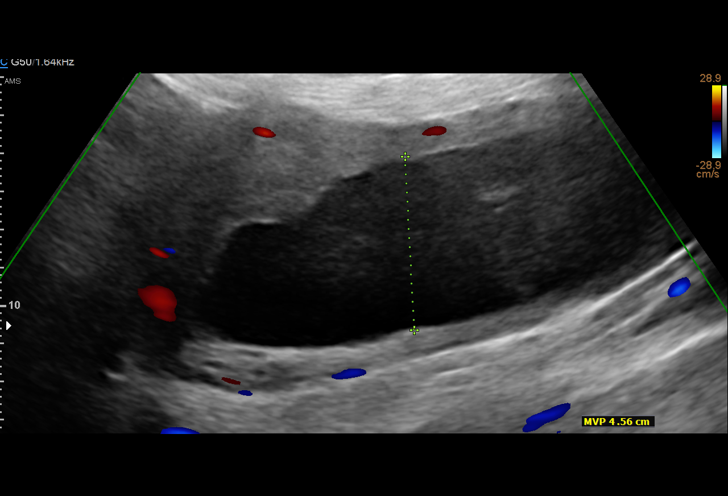
[im 16/19]
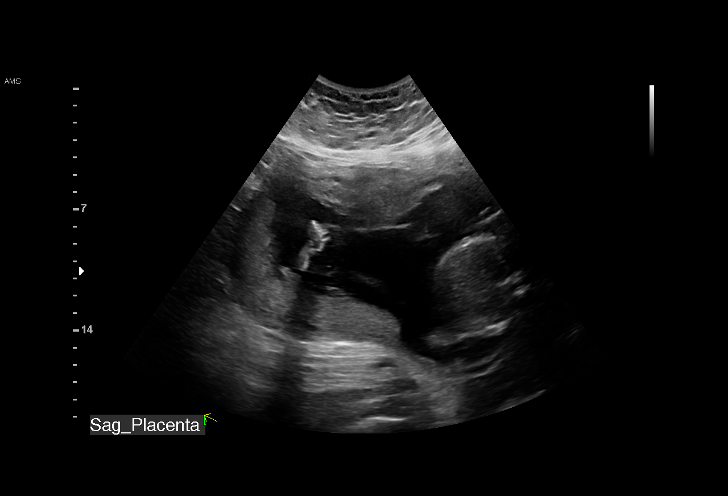
[im 17/19]
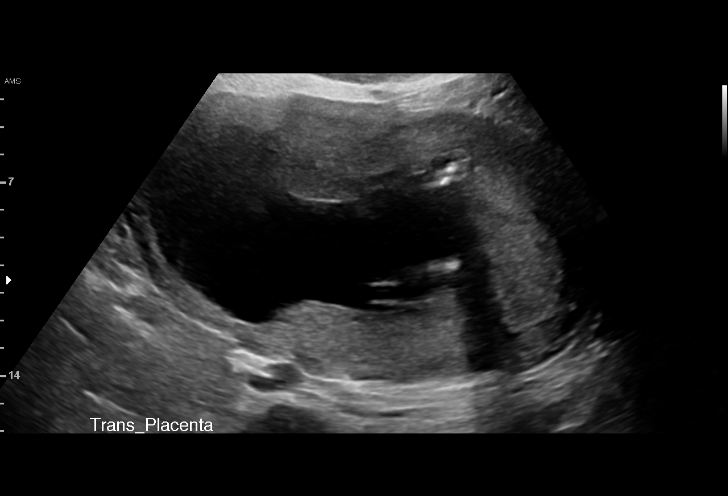
[im 19/19]
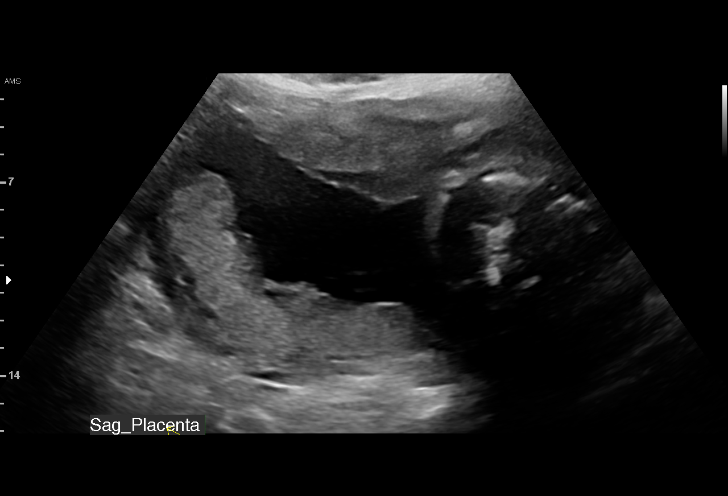

[14 of 19 positions shown; findings below may reference images not displayed]

OB/Gyn Clinic

 ----------------------------------------------------------------------

 ----------------------------------------------------------------------
Indications

  Obesity complicating pregnancy, second
  trimester (pregravid BMI 48)
  Previous cesarean delivery, antepartum x 2
  Short interval between pregancies, 2nd
  trimester
  23 weeks gestation of pregnancy
  Poor obstetric history: Previous gestational
  HTN
 ----------------------------------------------------------------------
Fetal Evaluation

 Num Of Fetuses:          1
 Fetal Heart Rate(bpm):   157
 Cardiac Activity:        Observed
 Presentation:            Breech
 Placenta:                Posterior
 P. Cord Insertion:       Previously Visualized

 Amniotic Fluid
 AFI FV:      Within normal limits

                             Largest Pocket(cm)

Biometry

 BPD:      52.1  mm     G. Age:  21w 6d          7  %    CI:        71.81   %    70 - 86
                                                         FL/HC:       18.9  %    19.2 -
 HC:      195.7  mm     G. Age:  21w 5d        < 3  %    HC/AC:       1.17       1.05 -
 AC:      166.8  mm     G. Age:  21w 5d          8  %    FL/BPD:      70.8  %    71 - 87
 FL:       36.9  mm     G. Age:  21w 5d          7  %    FL/AC:       22.1  %    20 - 24

 Est. FW:     446   gm          1 lb     21  %
OB History

 Gravidity:    3         Term:   2        Prem:   0        SAB:   0
 TOP:          0       Ectopic:  0        Living: 2
Gestational Age

 LMP:           23w 1d        Date:  11/24/17                 EDD:   08/31/18
 U/S Today:     21w 5d                                        EDD:   09/10/18
 Best:          23w 1d     Det. By:  LMP  (11/24/17)          EDD:   08/31/18
Anatomy

 Cranium:               Previously seen        LVOT:                   Previously seen
 Cavum:                 Previously seen        Aortic Arch:            Previously seen
 Ventricles:            Previously seen        Ductal Arch:            Previously seen
 Choroid Plexus:        Previously seen        Diaphragm:              Previously seen
 Cerebellum:            Previously seen        Stomach:                Appears normal, left
                                                                       sided
 Posterior Fossa:       Previously seen        Abdomen:                Appears normal
 Nuchal Fold:           Previously seen        Abdominal Wall:         Previously seen
 Face:                  Orbits appear          Cord Vessels:           Previously seen
                        normal
 Lips:                  Previously seen        Kidneys:                Appear normal
 Palate:                Previously seen        Bladder:                Appears normal
 Thoracic:              Previously seen        Spine:                  Previously seen
 Heart:                 Previously seen        Upper Extremities:      Previously seen
 RVOT:                  Previously seen        Lower Extremities:      Previously seen

 Other:  Heels and 5th digit visualized. Nasal bone visualized. Technically
         difficult due to maternal habitus.
Impression

 EFW 21% with AC 8th%
 Obesity
Recommendations

 Repeat growth in 3 weeks

## 2019-09-17 DIAGNOSIS — Z419 Encounter for procedure for purposes other than remedying health state, unspecified: Secondary | ICD-10-CM | POA: Diagnosis not present

## 2019-10-10 IMAGING — US US PELVIS COMPLETE WITH TRANSVAGINAL
1 series · 15 of 25 positions shown · non-contrast
Comparison: None

CLINICAL DATA: Pain after Caesarean section.  IUD strings loss.



[Series 1: us pelvis complete with transvaginal · 15 of 45 slices shown]
[im 1/45]
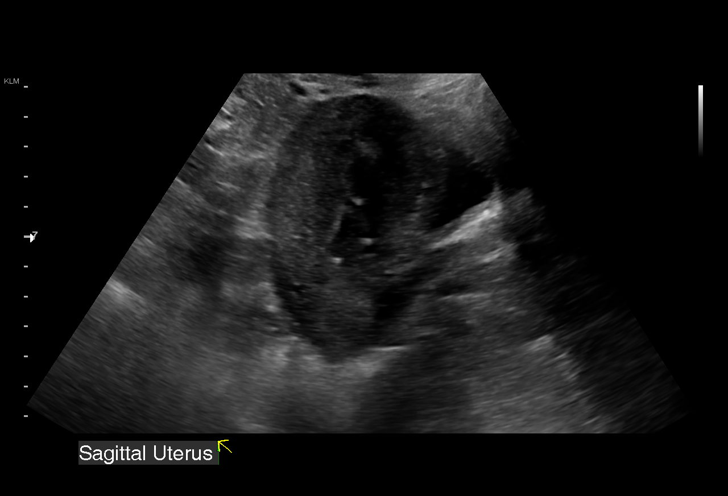
[im 4/45]
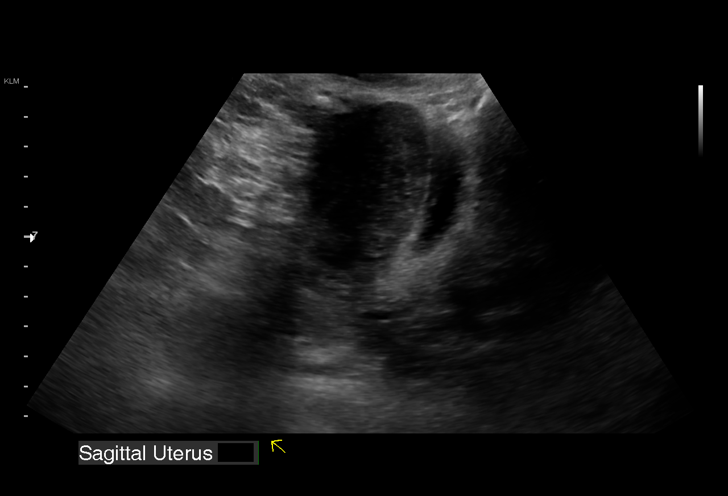
[im 8/45]
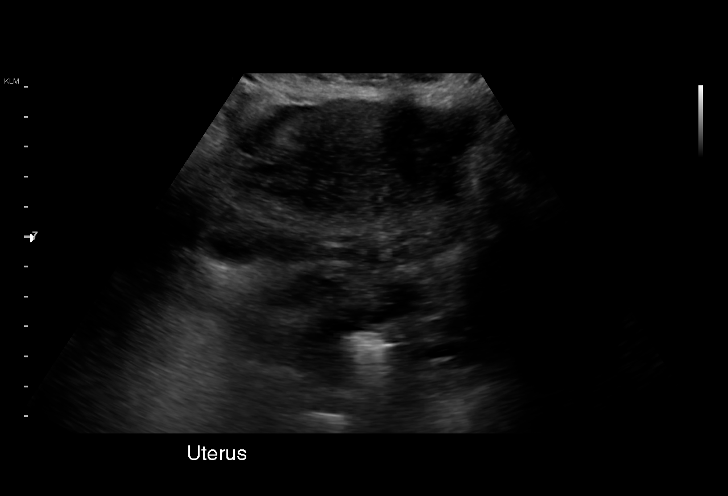
[im 10/45]
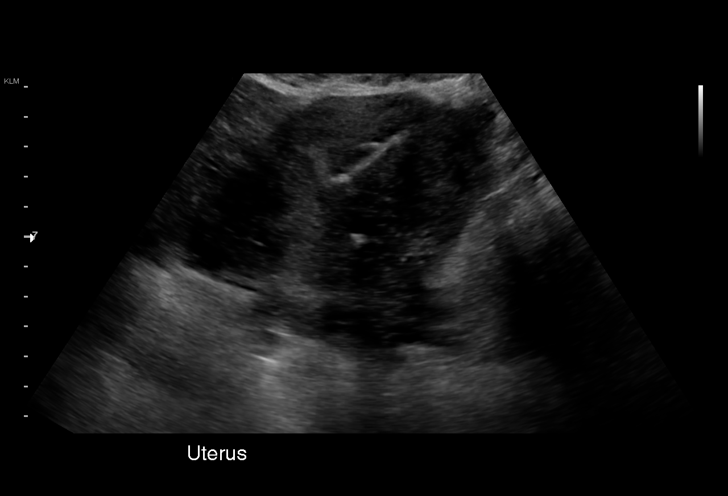
[im 13/45]
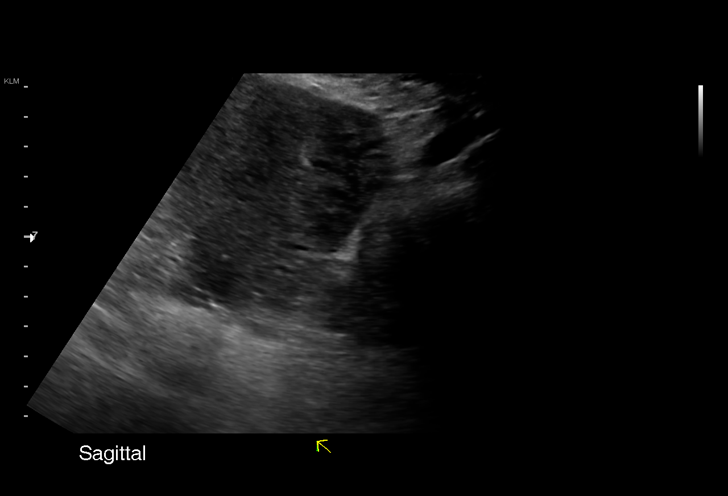
[im 17/45]
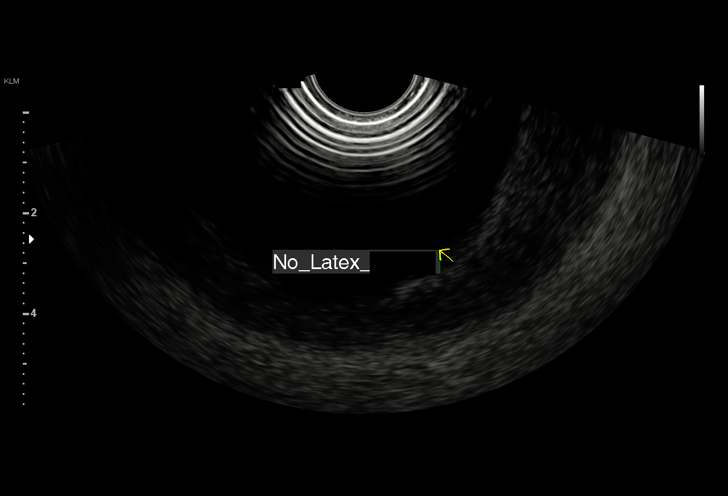
[im 19/45]
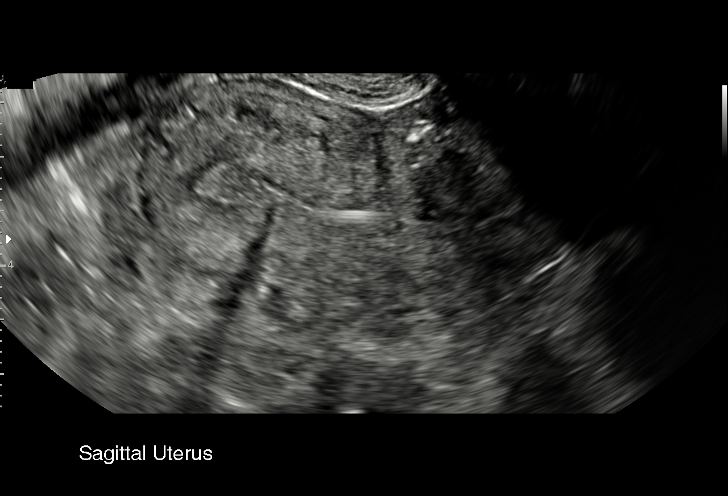
[im 23/45]
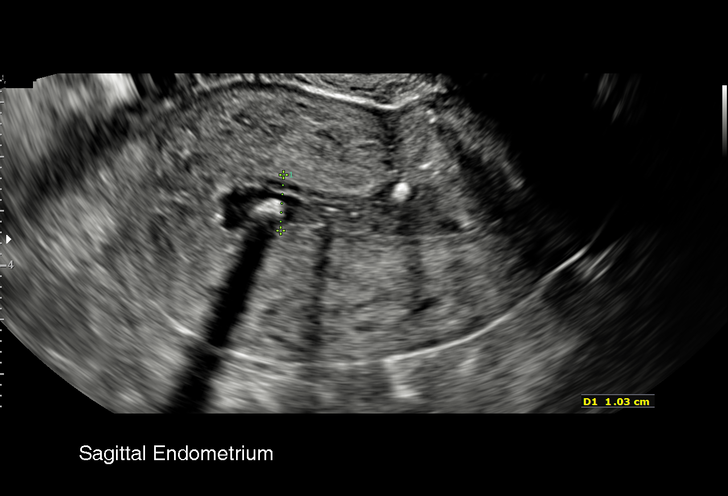
[im 26/45]
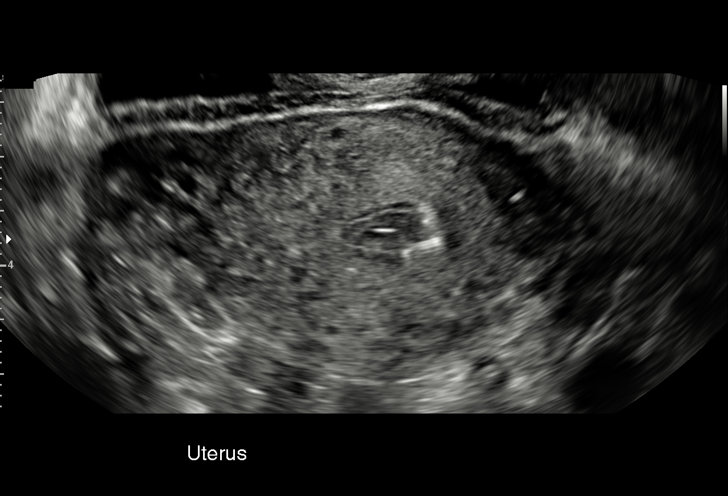
[im 28/45]
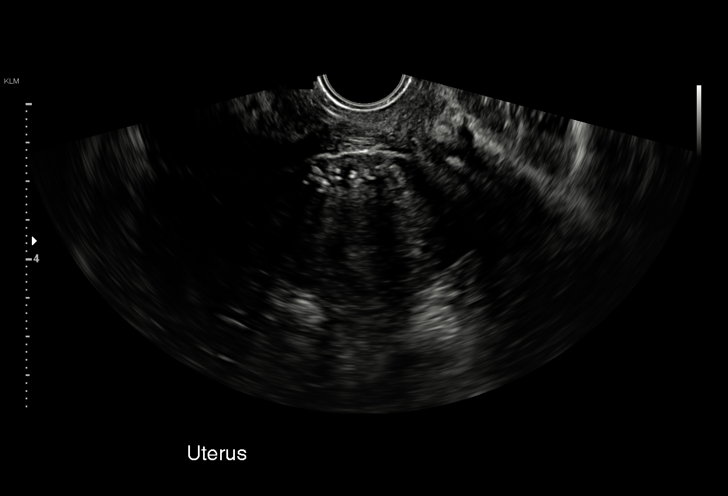
[im 32/45]
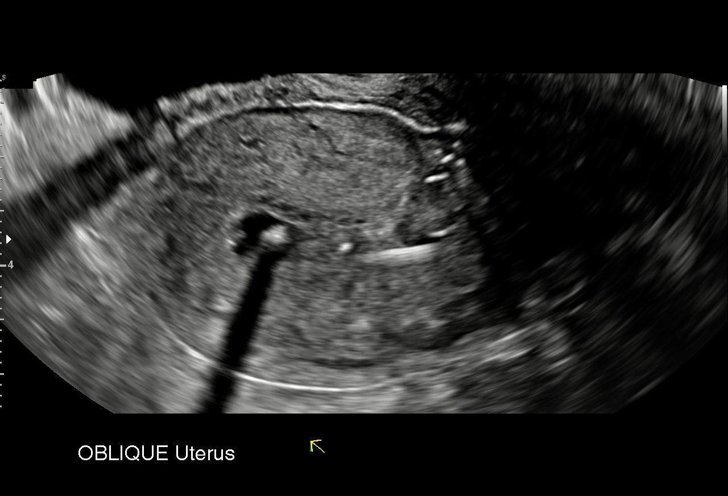
[im 35/45]
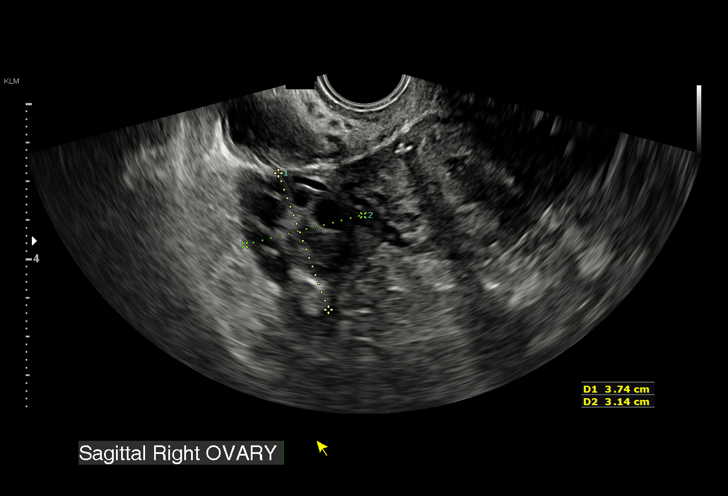
[im 37/45]
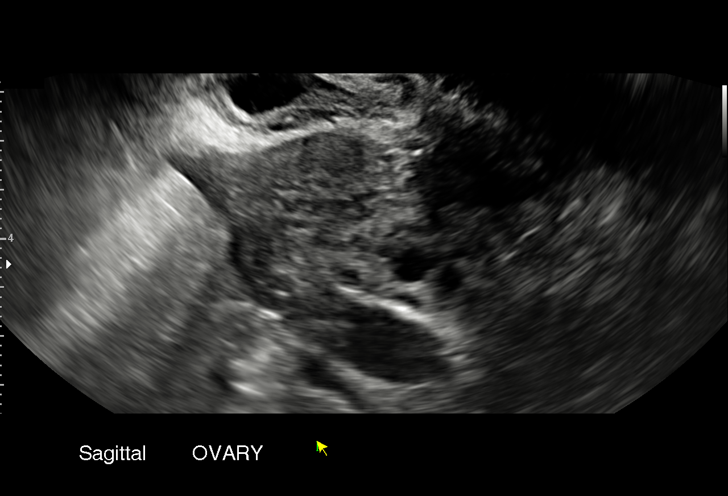
[im 41/45]
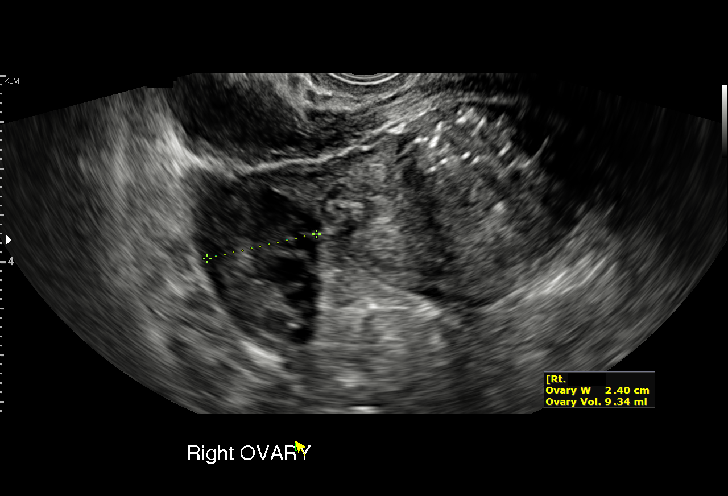
[im 45/45]
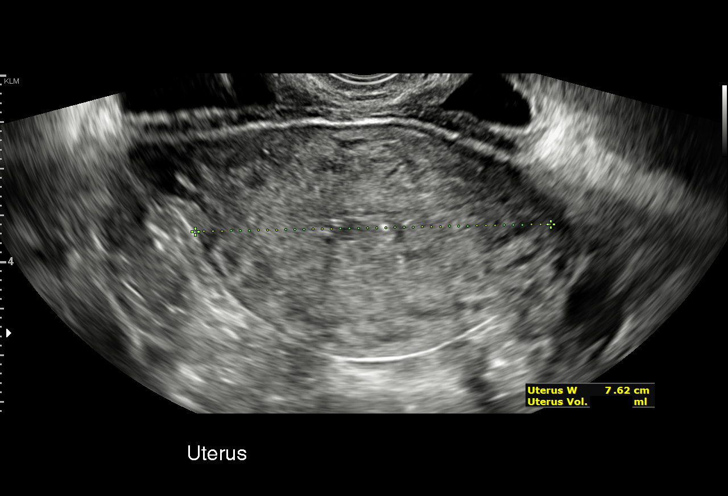

[15 of 25 positions shown; findings below may reference images not displayed]

FINDINGS: Uterus

Measurements: Anteverted uterus measures 8.9 x 5.2 x 7.6 cm =
volume: 182 mL. No fibroids or other mass visualized.

Endometrium

Thickness: Normal thickness at 10 mm. Mild heterogeneity to the
endometrium. Potential fluid within the canal. IUD in within the
endometrial canal.

Right ovary

Measurements: Normal size at 3.0 x 2.5 x 2.4 cm = volume: 9.4 mL.
Normal appearance/no adnexal mass.

Left ovary

Measurements: Normal size at 2.7 x 1.1 x 1.4 cm = volume: 2.0 mL.
Normal appearance/no adnexal mass.

Other findings

No abnormal free fluid.
IMPRESSION: 1. IUD within the endometrial canal.
2. Mild heterogeneity of endometrium with small amount of fluid the
canal. Endometrium is normal thickness.
3. Normal ovaries.

## 2019-10-18 DIAGNOSIS — Z419 Encounter for procedure for purposes other than remedying health state, unspecified: Secondary | ICD-10-CM | POA: Diagnosis not present

## 2019-11-18 DIAGNOSIS — Z419 Encounter for procedure for purposes other than remedying health state, unspecified: Secondary | ICD-10-CM | POA: Diagnosis not present

## 2019-12-18 DIAGNOSIS — Z419 Encounter for procedure for purposes other than remedying health state, unspecified: Secondary | ICD-10-CM | POA: Diagnosis not present

## 2020-01-18 DIAGNOSIS — Z419 Encounter for procedure for purposes other than remedying health state, unspecified: Secondary | ICD-10-CM | POA: Diagnosis not present

## 2020-02-17 DIAGNOSIS — Z419 Encounter for procedure for purposes other than remedying health state, unspecified: Secondary | ICD-10-CM | POA: Diagnosis not present

## 2020-03-02 ENCOUNTER — Telehealth: Payer: Medicaid Other | Admitting: Nurse Practitioner

## 2020-03-02 DIAGNOSIS — Z3009 Encounter for other general counseling and advice on contraception: Secondary | ICD-10-CM

## 2020-03-02 NOTE — Progress Notes (Signed)
Based on what you shared with me it looks like you have issues with your IUD,that should be evaluated in a face to face office visit. you will need an appoinment to get that removed. May I suggest that you contact the health department , since you have no PCP.i am nit sure if urgent cares or the ED will be bake to remove it, or if they will.  NOTE: If you entered your credit card information for this eVisit, you will not be charged. You may see a "hold" on your card for the $35 but that hold will drop off and you will not have a charge processed.  If you are having a true medical emergency please call 911.     For an urgent face to face visit, Catherine Cochran has four urgent care centers for your convenience:   . [facility]    [phone]                  Get Driving Directions  [address] [place], [place] [address] . 10 am to 8 pm Monday-Friday . 12 pm to 8 pm Saturday-Sunday   . [facility] at [facility]  [phone]                  Get Driving Directions  [address] [place], [place] [address] . 8 am to 8 pm Monday-Friday . 9 am to 6 pm Saturday . 11 am to 6 pm Sunday   . [facility] at [facility]  [phone]                  Get Driving Directions   [address] [place], [place] [address] . 8 am to 8 pm Monday-Friday . 8 am to 4 pm Saturday-Sunday    . [facility] at [facility] Directions  [phone]  [address], [address] [place], [place] [address]  . Monday-Friday, 12 PM to 6 PM    Your e-visit answers were reviewed by a board certified advanced clinical practitioner to complete your personal care plan.  Thank you for using e-Visits.

## 2020-03-19 DIAGNOSIS — Z419 Encounter for procedure for purposes other than remedying health state, unspecified: Secondary | ICD-10-CM | POA: Diagnosis not present

## 2020-04-19 DIAGNOSIS — Z419 Encounter for procedure for purposes other than remedying health state, unspecified: Secondary | ICD-10-CM | POA: Diagnosis not present

## 2020-05-17 DIAGNOSIS — Z419 Encounter for procedure for purposes other than remedying health state, unspecified: Secondary | ICD-10-CM | POA: Diagnosis not present

## 2020-06-17 DIAGNOSIS — Z419 Encounter for procedure for purposes other than remedying health state, unspecified: Secondary | ICD-10-CM | POA: Diagnosis not present

## 2020-07-17 DIAGNOSIS — Z419 Encounter for procedure for purposes other than remedying health state, unspecified: Secondary | ICD-10-CM | POA: Diagnosis not present

## 2020-07-19 ENCOUNTER — Ambulatory Visit: Payer: Medicaid Other | Admitting: Internal Medicine

## 2020-08-17 DIAGNOSIS — Z419 Encounter for procedure for purposes other than remedying health state, unspecified: Secondary | ICD-10-CM | POA: Diagnosis not present

## 2020-09-16 DIAGNOSIS — Z419 Encounter for procedure for purposes other than remedying health state, unspecified: Secondary | ICD-10-CM | POA: Diagnosis not present

## 2020-10-17 DIAGNOSIS — Z419 Encounter for procedure for purposes other than remedying health state, unspecified: Secondary | ICD-10-CM | POA: Diagnosis not present

## 2020-11-17 DIAGNOSIS — Z419 Encounter for procedure for purposes other than remedying health state, unspecified: Secondary | ICD-10-CM | POA: Diagnosis not present

## 2020-11-22 ENCOUNTER — Emergency Department (HOSPITAL_COMMUNITY)
Admission: EM | Admit: 2020-11-22 | Discharge: 2020-11-22 | Disposition: A | Discharge status: Home / Self Care | Payer: Medicaid Other | Attending: Emergency Medicine | Admitting: Emergency Medicine

## 2020-11-22 ENCOUNTER — Telehealth: Payer: Medicaid Other | Admitting: Physician Assistant

## 2020-11-22 ENCOUNTER — Ambulatory Visit
Admission: EM | Admit: 2020-11-22 | Discharge: 2020-11-22 | Disposition: A | Discharge status: Home / Self Care | Payer: Medicaid Other

## 2020-11-22 DIAGNOSIS — Z79899 Other long term (current) drug therapy: Secondary | ICD-10-CM | POA: Diagnosis not present

## 2020-11-22 DIAGNOSIS — F410 Panic disorder [episodic paroxysmal anxiety] without agoraphobia: Secondary | ICD-10-CM

## 2020-11-22 DIAGNOSIS — F419 Anxiety disorder, unspecified: Secondary | ICD-10-CM

## 2020-11-22 DIAGNOSIS — Z7982 Long term (current) use of aspirin: Secondary | ICD-10-CM | POA: Insufficient documentation

## 2020-11-22 DIAGNOSIS — I10 Essential (primary) hypertension: Secondary | ICD-10-CM | POA: Diagnosis not present

## 2020-11-22 LAB — CBC WITH DIFFERENTIAL/PLATELET
Abs Immature Granulocytes: 0.07 10*3/uL (ref 0.00–0.07)
Basophils Absolute: 0.1 10*3/uL (ref 0.0–0.1)
Basophils Relative: 1 %
Eosinophils Absolute: 0.3 10*3/uL (ref 0.0–0.5)
Eosinophils Relative: 2 %
HCT: 43.5 % (ref 36.0–46.0)
Hemoglobin: 14 g/dL (ref 12.0–15.0)
Immature Granulocytes: 1 %
Lymphocytes Relative: 26 %
Lymphs Abs: 3.5 10*3/uL (ref 0.7–4.0)
MCH: 28.6 pg (ref 26.0–34.0)
MCHC: 32.2 g/dL (ref 30.0–36.0)
MCV: 89 fL (ref 80.0–100.0)
Monocytes Absolute: 0.6 10*3/uL (ref 0.1–1.0)
Monocytes Relative: 5 %
Neutro Abs: 9.1 10*3/uL — ABNORMAL HIGH (ref 1.7–7.7)
Neutrophils Relative %: 65 %
Platelets: 347 10*3/uL (ref 150–400)
RBC: 4.89 MIL/uL (ref 3.87–5.11)
RDW: 14.6 % (ref 11.5–15.5)
WBC: 13.7 10*3/uL — ABNORMAL HIGH (ref 4.0–10.5)
nRBC: 0 % (ref 0.0–0.2)

## 2020-11-22 LAB — BASIC METABOLIC PANEL
Anion gap: 4 — ABNORMAL LOW (ref 5–15)
BUN: 8 mg/dL (ref 6–20)
CO2: 26 mmol/L (ref 22–32)
Calcium: 9.7 mg/dL (ref 8.9–10.3)
Chloride: 108 mmol/L (ref 98–111)
Creatinine, Ser: 0.83 mg/dL (ref 0.44–1.00)
GFR, Estimated: >60 mL/min (ref >60)
Glucose, Bld: 102 mg/dL — ABNORMAL HIGH (ref 70–99)
Potassium: 4.6 mmol/L (ref 3.5–5.1)
Sodium: 138 mmol/L (ref 135–145)

## 2020-11-22 LAB — MAGNESIUM: Magnesium: 1.9 mg/dL (ref 1.7–2.4)

## 2020-11-22 MED ORDER — HYDROXYZINE HCL 25 MG PO TABS: 25.0000 mg | ORAL_TABLET | Freq: Four times a day (QID) | ORAL | 0 refills | Status: DC | PRN

## 2020-11-22 NOTE — ED Provider Notes (Signed)
Emergency Medicine Provider Triage Evaluation Note  Catherine Cochran , a 25 y.o. female  was evaluated in triage.  Pt complains of anxiety and lightheadedness.  She has had increased anxiety over the last week.  Patient reports feeling lightheaded with her anxiety.  Patient last felt lightheaded this morning.  Patient reports that she had left-sided chest pain associated with her anxiety 2 days prior.  No chest pain since then.  Patient denies seeing any medication for anxiety.  Denies any illicit drug use or alcohol use.    Review of Systems  Positive: Anxiety, lightheadedness Negative: Syncope, numbness, weakness, facial asymmetry, aphasia, dysphagia, visual disturbance, shortness of breath  Physical Exam  BP (!) 124/91 (BP Location: Left Arm)   Pulse 63   Temp 99.4 F (37.4 C)   Resp 18   LMP  (Within Years)   SpO2 100%  Gen:   Awake, no distress   Resp:  Normal effort, lungs clear to auscultation bilaterally MSK:   Moves extremities without difficulty  Other:    Medical Decision Making  Medically screening exam initiated at 12:38 PM.  Appropriate orders placed.  Rebeca Valdivia was informed that the remainder of the evaluation will be completed by another provider, this initial triage assessment does not replace that evaluation, and the importance of remaining in the ED until their evaluation is complete.  The patient appears stable so that the remainder of the work up may be completed by another provider.      Haskel Schroeder, PA-C 11/22/20 1240    Mancel Bale, MD 11/22/20 [phone]

## 2020-11-22 NOTE — Progress Notes (Signed)
Catherine Cochran, Catherine Cochran are scheduled for a virtual visit with your provider today.    Just as we do with appointments in the office, we must obtain your consent to participate.  Your consent will be active for this visit and any virtual visit you may have with one of our providers in the next 365 days.    If you have a MyChart account, I can also send a copy of this consent to you electronically.  All virtual visits are billed to your insurance company just like a traditional visit in the office.  As this is a virtual visit, video technology does not allow for your provider to perform a traditional examination.  This may limit your provider's ability to fully assess your condition.  If your provider identifies any concerns that need to be evaluated in person or the need to arrange testing such as labs, EKG, etc, we will make arrangements to do so.    Although advances in technology are sophisticated, we cannot ensure that it will always work on either your end or our end.  If the connection with a video visit is poor, we may have to switch to a telephone visit.  With either a video or telephone visit, we are not always able to ensure that we have a secure connection.   I need to obtain your verbal consent now.   Are you willing to proceed with your visit today?   Catherine Cochran has provided verbal consent on 11/22/2020 for a virtual visit (video or telephone).   Dierdre Forth, PA-C 11/22/2020  9:50 AM   Date:  11/22/2020   ID:  Catherine Cochran, DOB [birth year], MRN [id number]  Patient Location: Home Provider Location: Home Office   Participants: Patient and Provider for Visit and Wrap up  Method of visit: Video  Location of Patient: Home Location of Provider: Home Office Consent was obtain for visit over the video. Services rendered by provider: Visit was performed via video  A video enabled telemedicine application was used and I verified that I am speaking with the correct person using two  identifiers.  PCP:  Patient, No Pcp Per (Inactive)   Chief Complaint:  anxiety  History of Present Illness:    Catherine Cochran is a 25 y.o. female with history as stated below. Presents video telehealth for an acute care visit  Pt reports she has been feeling anxious recently.  She reports mild anxiety previously, but she began having multiple panic attacks per day over the last week. She reports she feels like she more anxious when people around her are smoking.  Pt reports she was smoking black and mild cigars, but  Pt reports she works for [employer] and had to stop during her shift because she had an anxiety attack.  States she was hyperventilating and became lightheaded. Denies syncope.  Pt reports she called her PCP, but her PCP retired and she will need to find a new one.   Modifying factors include: slowing her breathing and taking deep breaths does seem to help but takes longer than she would like.  No other aggravating or relieving factors.  No other c/o.  The patient does not have symptoms concerning for COVID-19 infection (fever, chills, cough, or new shortness of breath).  Patient has not been tested for COVID during this illness - result: n/a  Past Medical, Surgical, Social History, Allergies, and Medications have been Reviewed.  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   Supervision of high risk pregnancy, antepartum 03/21/2018   History of gestational hypertension 03/21/2018   History of 2 cesarean sections 03/21/2018    Social History   Tobacco Use   Smoking status: Never   Smokeless tobacco: Never  Substance Use  Topics   Alcohol use: Not Currently     Current Outpatient Medications:    acetaminophen (TYLENOL) 500 MG tablet, Take 500-1,000 mg by mouth every 6 (six) hours as needed for moderate pain or headache., Disp: , Rfl:    aspirin EC 81 MG tablet, Take 81 mg by mouth daily., Disp: , Rfl:    docusate sodium (COLACE) 100 MG capsule, Take 1 capsule (100 mg total) by mouth 2 (two) times daily as needed. (Patient taking differently: Take 100 mg by mouth 2 (two) times daily as needed for mild constipation. ), Disp: 60 capsule, Rfl: 3   ferrous gluconate (FERGON) 324 MG tablet, Take 1 tablet (324 mg total) by mouth 2 (two) times daily with a meal., Disp: 60 tablet, Rfl: 2   ibuprofen (ADVIL) 800 MG tablet, Take 1 tablet (800 mg total) by mouth every 8 (eight) hours as needed., Disp: 60 tablet, Rfl: 1   Prenatal Vit-Fe Fumarate-FA (PRENATAL PO), Take 2 tablets by mouth daily., Disp: , Rfl:    senna-docusate (SENOKOT-S) 8.6-50 MG tablet, Take 2 tablets by mouth daily., Disp: 20 tablet, Rfl: 0   No Known Allergies   Review of Systems  Constitutional:  Negative for chills and fever.  HENT:  Negative for congestion, ear pain and sore throat.   Eyes:  Negative for blurred vision and double vision.  Respiratory:  Negative for cough, shortness of breath and wheezing.   Cardiovascular:  Negative for chest pain, palpitations and leg swelling.  Gastrointestinal:  Negative for abdominal pain, diarrhea, nausea and vomiting.  Genitourinary:  Negative for dysuria.  Musculoskeletal:  Negative for myalgias.  Skin:  Negative for rash.  Neurological:  Negative for loss of consciousness, weakness and headaches.  Psychiatric/Behavioral:  The patient is nervous/anxious.   See HPI for history of present illness.  Physical Exam Constitutional:      General: She is not in acute distress.    Appearance: Normal appearance. She is not ill-appearing.  HENT:     Head: Normocephalic and atraumatic.     Nose: No  congestion.  Eyes:     Extraocular Movements: Extraocular movements intact.  Pulmonary:     [place]: Pulmonary effort is normal.     Comments: Speaks in full sentences Musculoskeletal:        General: Normal range of motion.     Cervical back: Normal range of motion.  Skin:    Coloration: Skin is not pale.  Neurological:     General: No focal deficit present.     Mental Status: She is alert. Mental status is at baseline.  Psychiatric:        Mood and Affect: Mood is anxious. Affect is tearful.              A&P  1. Anxiety  - increased in the last 1 week  - no previous treatments  - will need physical evaluation today  - recommend [facility] urgent  care or PCP appointment today.   Patient voiced understanding and agreement to plan.   Time:   Today, I have spent 15 minutes with the patient with telehealth technology discussing the above problems, reviewing the chart, previous notes, medications and orders.    Tests Ordered: No orders of the defined types were placed in this encounter.   Medication Changes: No orders of the defined types were placed in this encounter.    Disposition:  Follow up Urgent care today  SignedDierdre Forth, PA-C  11/22/2020 10:02 AM

## 2020-11-22 NOTE — ED Triage Notes (Signed)
Patient here with complaint of multiple episodes of feeling very anxious for the last week. Patient states when she gets anxious she also starts to feel lightheaded. Patient alert, oriented, ambulatory, and in no apparent distress at this time.

## 2020-11-22 NOTE — Patient Instructions (Addendum)
1. Anxiety  - increased in the last 1 week  - no previous treatments  - will need physical evaluation today  - recommend [facility] urgent care or PCP appointment today.   Follow-up Today with Jerome at [facility]:  [facility] at [facility] [address] [place],  [place]  [address] [phone]

## 2020-11-22 NOTE — Discharge Instructions (Addendum)
Contact a health care provider if you: °Have a hard time staying focused or finishing daily tasks. °Spend many hours a day feeling worried about everyday life. °Become exhausted by worry. °Start to have headaches, feel tense, or have nausea. °Urinate more than normal. °Have diarrhea. °Get help right away if you have: °A racing heart and shortness of breath. °Thoughts of hurting yourself or others. °

## 2020-11-22 NOTE — ED Provider Notes (Signed)
[facility] EMERGENCY DEPARTMENT Provider Note   CSN: [id number] Arrival date & time: 11/22/20  1132     History Chief Complaint  Patient presents with   Anxiety    Catherine Cochran is a 24 y.o. female.  Who presents emergency department with chief complaint of panic attacks.  Patient states that she has a history of panic disorder and is currently not on any medications and has been to counseling in the past.  This week she has had an increase in frequency this seems to be triggered by cigarette smoking.  She states she has friends and family members who smoke and when she is around them she begins to feel overwhelmed, short of breath and begins hyperventilating.  She has an overwhelming sense of dread and it takes her some time to calm herself down.  She denies any chest pain, shortness of breath, unilateral leg swelling.  She does not take any oral contraceptives.  She has no previous history of DVT or PE.  She has no complaints at this time.   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   Supervision of high risk pregnancy, antepartum 03/21/2018   History of gestational hypertension 03/21/2018   History of 2 cesarean sections 03/21/2018    Past Surgical History:  Procedure Laterality Date   CESAREAN SECTION  2017, 2018   CESAREAN SECTION N/A 08/25/2018   Procedure: CESAREAN SECTION;  Surgeon: Conan Bowens, MD;  Location: [facility] LD ORS;  Service: Obstetrics;  Laterality: N/A;   TONSILLECTOMY     2nd grade   WISDOM  TOOTH EXTRACTION       OB History     Gravida  3   Para  3   Term  3   Preterm      AB      Living  3      SAB      IAB      Ectopic      Multiple  0   Live Births  3           Family History  Problem Relation Age of Onset   Diabetes Mother    HIV Mother    Asthma Mother    Asthma Brother     Social History   Tobacco Use   Smoking status: Never   Smokeless tobacco: Never  Vaping Use   Vaping Use: Never used  Substance Use Topics   Alcohol use: Not Currently   Drug use: Never    Home Medications Prior to Admission medications   Medication Sig Start Date End Date Taking? Authorizing Provider  acetaminophen (TYLENOL) 500 MG tablet Take 500-1,000 mg by mouth every 6 (six) hours as needed for moderate pain or headache.    [provider]  aspirin EC 81 MG tablet Take 81 mg by mouth daily.    [provider]  docusate sodium (COLACE) 100 MG capsule Take 1 capsule (100 mg total) by mouth 2 (two) times daily as  needed. Patient taking differently: Take 100 mg by mouth 2 (two) times daily as needed for mild constipation.  06/02/18   Preston Bing, MD  ferrous gluconate (FERGON) 324 MG tablet Take 1 tablet (324 mg total) by mouth 2 (two) times daily with a meal. 06/02/18    Bing, MD  ibuprofen (ADVIL) 800 MG tablet Take 1 tablet (800 mg total) by mouth every 8 (eight) hours as needed. 08/27/18   Arvilla Market, MD  Prenatal Vit-Fe Fumarate-FA (PRENATAL PO) Take 2 tablets by mouth daily.    [provider]  senna-docusate (SENOKOT-S) 8.6-50 MG tablet Take 2 tablets by mouth daily. 08/28/18   Arvilla Market, MD    Allergies    Patient has no known allergies.  Review of Systems   Review of Systems Ten systems reviewed and are negative for acute change, except as noted in the HPI.   Physical Exam Updated Vital Signs BP (!) 124/91 (BP Location: Left Arm)   Pulse 63   Temp 99.4 F (37.4 C)   Resp  18   LMP  (Within Years)   SpO2 100%   Physical Exam Physical Exam  Nursing note and vitals reviewed. Constitutional: She is oriented to person, place, and time. She appears well-developed and well-nourished. No distress.  HENT:  Head: Normocephalic and atraumatic.  Eyes: Conjunctivae normal and EOM are normal. Pupils are equal, round, and reactive to light. No scleral icterus.  Neck: Normal range of motion.  Cardiovascular: Normal rate, regular rhythm and normal heart sounds.  Exam reveals no gallop and no friction rub.   No murmur heard. Pulmonary/Chest: [place] normal and breath sounds normal. No respiratory distress.  Abdominal: Soft. Bowel sounds are normal. She exhibits no distension and no mass. There is no tenderness. There is no guarding.  Neurological: She is alert and oriented to person, place, and time.  Skin: Skin is warm and dry. She is not diaphoretic.   ED Results / Procedures / Treatments   Labs (all labs ordered are listed, but only abnormal results are displayed) Labs Reviewed  BASIC METABOLIC PANEL - Abnormal; Notable for the following components:      Result Value   Glucose, Bld 102 (*)    Anion gap 4 (*)    All other components within normal limits  CBC WITH DIFFERENTIAL/PLATELET - Abnormal; Notable for the following components:   WBC 13.7 (*)    Neutro Abs 9.1 (*)    All other components within normal limits  MAGNESIUM    EKG None  Radiology No results found.  Procedures Procedures   Medications Ordered in ED Medications - No data to display  ED Course  I have reviewed the triage vital signs and the nursing notes.  Pertinent labs & imaging results that were available during my care of the patient were reviewed by me and considered in my medical decision making (see chart for details).    MDM Rules/Calculators/A&P                            Patient here with complaint of anxiety.  We will give her a referral to the behavioral health  urgent care.  I have no suspicion for other etiology of her symptoms such as hyperthyroidism, tacky arrhythmia, pulmonary embolus.  I reviewed the patient's labs which shows slightly elevated white blood cell count, normal magnesium level, normal BMP with slightly elevated blood glucose Will discharge with hydroxyzine.  Resources  given at discharge.  Discussed return precautions. Final Clinical Impression(s) / ED Diagnoses Final diagnoses:  None    Rx / DC Orders ED Discharge Orders     None        Arthor Captain, PA-C 11/22/20 1517    Margarita Grizzle, MD 11/24/20 1346

## 2020-11-23 ENCOUNTER — Telehealth: Payer: Self-pay

## 2020-11-23 NOTE — Telephone Encounter (Signed)
Transition Care Management Unsuccessful Follow-up Telephone Call  Date of discharge and from where:  11/22/2020-[facility]   Attempts:  1st Attempt  Reason for unsuccessful TCM follow-up call:  Left voice message

## 2020-11-24 NOTE — Telephone Encounter (Signed)
Transition Care Management Follow-up Telephone Call Date of discharge and from where: 11/22/2020 - [facility] How have you been since you were released from the hospital? "I am doing okay" Any questions or concerns? No  Items Reviewed: Did the pt receive and understand the discharge instructions provided? Yes  Medications obtained and verified?  N/A Other? No  Any new allergies since your discharge? No  Dietary orders reviewed? No Do you have support at home? No    Functional Questionnaire: (I = Independent and D = Dependent) ADLs: I  Bathing/Dressing- I  Meal Prep- I  Eating- I  Maintaining continence- I  Transferring/Ambulation- I  Managing Meds- I  Follow up appointments reviewed:  PCP Hospital f/u appt confirmed? No   Specialist Hospital f/u appt confirmed? No   Are transportation arrangements needed? No  If their condition worsens, is the pt aware to call PCP or go to the Emergency Dept.? Yes Was the patient provided with contact information for the PCP's office or ED? Yes Was to pt encouraged to call back with questions or concerns? Yes

## 2020-12-17 DIAGNOSIS — Z419 Encounter for procedure for purposes other than remedying health state, unspecified: Secondary | ICD-10-CM | POA: Diagnosis not present

## 2020-12-20 ENCOUNTER — Ambulatory Visit: Payer: Medicaid Other | Admitting: Physician Assistant

## 2020-12-23 ENCOUNTER — Ambulatory Visit: Payer: Medicaid Other | Admitting: Registered Nurse

## 2021-01-02 ENCOUNTER — Ambulatory Visit: Payer: Self-pay | Admitting: Nurse Practitioner

## 2021-01-09 ENCOUNTER — Ambulatory Visit: Payer: Self-pay | Admitting: Nurse Practitioner

## 2021-01-17 DIAGNOSIS — Z419 Encounter for procedure for purposes other than remedying health state, unspecified: Secondary | ICD-10-CM | POA: Diagnosis not present

## 2021-02-02 ENCOUNTER — Ambulatory Visit (INDEPENDENT_AMBULATORY_CARE_PROVIDER_SITE_OTHER): Payer: Medicaid Other | Admitting: Nurse Practitioner

## 2021-02-02 ENCOUNTER — Encounter: Payer: Self-pay | Admitting: Nurse Practitioner

## 2021-02-02 ENCOUNTER — Other Ambulatory Visit: Payer: Self-pay

## 2021-02-02 VITALS — BP 122/80 | HR 98 | Temp 99.4°F | Ht 65.6 in | Wt 318.0 lb

## 2021-02-02 DIAGNOSIS — F320 Major depressive disorder, single episode, mild: Secondary | ICD-10-CM

## 2021-02-02 DIAGNOSIS — E559 Vitamin D deficiency, unspecified: Secondary | ICD-10-CM | POA: Diagnosis not present

## 2021-02-02 DIAGNOSIS — Z6841 Body Mass Index (BMI) 40.0 and over, adult: Secondary | ICD-10-CM | POA: Diagnosis not present

## 2021-02-02 DIAGNOSIS — E6609 Other obesity due to excess calories: Secondary | ICD-10-CM

## 2021-02-02 DIAGNOSIS — Z23 Encounter for immunization: Secondary | ICD-10-CM

## 2021-02-02 DIAGNOSIS — Z7689 Persons encountering health services in other specified circumstances: Secondary | ICD-10-CM | POA: Diagnosis not present

## 2021-02-02 MED ORDER — ESCITALOPRAM OXALATE 10 MG PO TABS: 10.0000 mg | ORAL_TABLET | Freq: Every day | ORAL | 2 refills | Status: DC

## 2021-02-02 NOTE — Progress Notes (Signed)
I,Katawbba Wiggins,acting as a Neurosurgeon for [employer], NP.,have documented all relevant documentation on the behalf of [employer], NP,as directed by  Charlesetta Ivory, NP while in the presence of Charlesetta Ivory, NP.  This visit occurred during the SARS-CoV-2 public health emergency.  Safety protocols were in place, including screening questions prior to the visit, additional usage of staff PPE, and extensive cleaning of exam room while observing appropriate contact time as indicated for disinfecting solutions.  Subjective:     Patient ID: Catherine Cochran , female    DOB: 1995-04-07 , 25 y.o.   MRN: [id number]   Chief Complaint  Patient presents with   Establish Care    HPI  The patient is here today to establish care and to get refills on her anxiety medication and iron medication.  She was seeing another PCP. 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 She does not have a OBGYN. She is going to come back in 4-6 weeks for lexapro follow up and physical exam. She is here because she has been feeling stressed out lately due to family issues. She does not take hydroxyzine anymore. She would like to see a psychiatrist and also start on medication to help her with her depression and anxiety.     Past Medical History:  Diagnosis Date   Anemia    Hypertension    Gestational HTN 2018     Family History  Problem Relation Age of Onset   Diabetes Mother    HIV Mother    Asthma Mother    Asthma Brother      Current Outpatient Medications:    escitalopram (LEXAPRO) 10 MG tablet, Take 1 tablet (10 mg total) by mouth daily., Disp: 30 tablet, Rfl: 2   ferrous gluconate (FERGON) 324 MG tablet, Take 1 tablet (324 mg total) by mouth 2 (two) times daily with a meal. (Patient not taking: Reported on 02/02/2021), Disp: 60 tablet, Rfl: 2   No Known Allergies   Review of Systems  Constitutional: Negative.  Negative for chills and fever.  HENT:  Negative for congestion.    Respiratory: Negative.  Negative for choking, shortness of breath and wheezing.   Cardiovascular: Negative.  Negative for chest pain and palpitations.  Gastrointestinal: Negative.  Negative for constipation and diarrhea.  Endocrine: Negative for polydipsia, polyphagia and polyuria.  Musculoskeletal:  Negative for arthralgias and myalgias.  Neurological:  Negative for dizziness, numbness and headaches.  Psychiatric/Behavioral:  Positive for dysphoric mood.   All other systems reviewed and are negative.   Today's Vitals   02/02/21 0859  BP: 122/80  Pulse: 98  Temp: 99.4 F (37.4 C)  Weight: (!) 318 lb (144.2 kg)  Height: 5' 5.6" (1.666 m)   Body mass index is 51.95 kg/m.  Wt Readings from Last 3 Encounters:  02/02/21 (!) 318 lb (144.2 kg)  08/25/18 292 lb (132.5 kg)  08/04/18 292 lb 1.6 oz (132.5 kg)    BP Readings from Last 3 Encounters:  02/02/21 122/80  11/22/20 123/79  09/15/18 130/89  . Objective:  Physical Exam Constitutional:      Appearance: Normal appearance. She is obese.  HENT:     Head: Normocephalic and atraumatic.  Cardiovascular:     Rate and Rhythm: Normal rate and regular rhythm.     Pulses: Normal pulses.     Heart sounds: Normal heart sounds. No murmur heard. Pulmonary:     Effort: Pulmonary effort is normal. No respiratory distress.  Breath sounds: Normal breath sounds. No wheezing.  Skin:    General: Skin is warm and dry.     Capillary Refill: Capillary refill takes less than 2 seconds.  Neurological:     Mental Status: She is alert and oriented to person, place, and time.  Psychiatric:        Mood and Affect: Affect is tearful.       Assessment And Plan:     1. Encounter to establish care --Patient is here to establish care. Micah Flesher over patient medical, family, social and surgical history. -Reviewed with patient their medications and any allergies  -Reviewed with patient their sexual orientation, drug/tobacco and alcohol use -Dicussed  any new concerns with patient  -recommended patient comes in for a physical exam and complete blood work.  -Educated patient about the importance of annual screenings and immunizations.  -Advised patient to eat a healthy diet along with exercise for atleast 30-45 min atleast 4-5 days of the week.   2. Current mild episode of major depressive disorder, unspecified whether recurrent (HCC) -She was recently seen in the ED for anxiety. She was taking hydroxyzine as needed.  -She would like to take something that she can everyday for her mood and anxiety  -Advised patient about relaxation techniques she can use for her anxiety.  - Ambulatory referral to Psychiatry - escitalopram (LEXAPRO) 10 MG tablet; Take 1 tablet (10 mg total) by mouth daily.  Dispense: 30 tablet; Refill: 2  3. Vitamin D deficiency -Will check and supplement if needed. Advised patient to spend atleast 15 min. Daily in sunlight.  - Vitamin D (25 hydroxy)  4. Need for influenza vaccination - Flu Vaccine QUAD 6+ mos PF IM (Fluarix Quad PF)  5. Class 1 obesity due to excess calories without serious comorbidity in adult, unspecified BMI  Advised patient on a healthy diet including avoiding fast food and red meats. Increase the intake of lean meats including grilled chicken and [place].  Drink a lot of water. Decrease intake of fatty foods. Exercise for 30-45 min. 4-5 a week to decrease the risk of cardiac event.   The patient was encouraged to call or send a message through MyChart for any questions or concerns.   Follow up: if symptoms persist or do not get better.   Side effects and appropriate use of all the medication(s) were discussed with the patient today. Patient advised to use the medication(s) as directed by their healthcare provider. The patient was encouraged to read, review, and understand all associated package inserts and contact our office with any questions or concerns. The patient accepts the risks of the  treatment plan and had an opportunity to ask questions.   Staying healthy and adopting a healthy lifestyle for your overall health is important. You should eat 7 or more servings of fruits and vegetables per day. You should drink plenty of water to keep yourself hydrated and your kidneys healthy. This includes about 65-80+ fluid ounces of water. Limit your intake of animal fats especially for elevated cholesterol. Avoid highly processed food and limit your salt intake if you have hypertension. Avoid foods high in saturated/Trans fats. Along with a healthy diet it is also very important to maintain time for yourself to maintain a healthy mental health with low stress levels. You should get atleast 150 min of moderate intensity exercise weekly for a healthy heart. Along with eating right and exercising, aim for at least 7-9 hours of sleep daily.  Eat more whole grains  which includes barley, wheat berries, oats, brown rice and whole wheat pasta. Use healthy plant oils which include olive, soy, corn, sunflower and peanut. Limit your caffeine and sugary drinks. Limit your intake of fast foods. Limit milk and dairy products to one or two daily servings.   Patient was given opportunity to ask questions. Patient verbalized understanding of the plan and was able to repeat key elements of the plan. All questions were answered to their satisfaction.  Raman Charron Coultas, DNP   I, Raman Wayden Schwertner have reviewed all documentation for this visit. The documentation on 02/02/21 for the exam, diagnosis, procedures, and orders are all accurate and complete.   IF YOU HAVE BEEN REFERRED TO A SPECIALIST, IT MAY TAKE 1-2 WEEKS TO SCHEDULE/PROCESS THE REFERRAL. IF YOU HAVE NOT HEARD FROM US/SPECIALIST IN TWO WEEKS, PLEASE GIVE [place] A CALL AT [phone].   THE PATIENT IS ENCOURAGED TO PRACTICE SOCIAL DISTANCING DUE TO THE COVID-19 PANDEMIC.

## 2021-02-03 LAB — VITAMIN D 25 HYDROXY (VIT D DEFICIENCY, FRACTURES): Vit D, 25-Hydroxy: 9.7 ng/mL — ABNORMAL LOW (ref 30.0–100.0)

## 2021-02-07 ENCOUNTER — Other Ambulatory Visit: Payer: Self-pay | Admitting: Nurse Practitioner

## 2021-02-07 DIAGNOSIS — E559 Vitamin D deficiency, unspecified: Secondary | ICD-10-CM

## 2021-02-07 MED ORDER — VITAMIN D (ERGOCALCIFEROL) 1.25 MG (50000 UNIT) PO CAPS
50000.0000 [IU] | ORAL_CAPSULE | ORAL | 1 refills | Status: DC
Start: 2021-02-07 — End: 2021-05-02

## 2021-02-16 DIAGNOSIS — Z419 Encounter for procedure for purposes other than remedying health state, unspecified: Secondary | ICD-10-CM | POA: Diagnosis not present

## 2021-03-19 DIAGNOSIS — Z419 Encounter for procedure for purposes other than remedying health state, unspecified: Secondary | ICD-10-CM | POA: Diagnosis not present

## 2021-04-19 DIAGNOSIS — Z419 Encounter for procedure for purposes other than remedying health state, unspecified: Secondary | ICD-10-CM | POA: Diagnosis not present

## 2021-04-26 ENCOUNTER — Other Ambulatory Visit: Payer: Self-pay

## 2021-04-26 DIAGNOSIS — F320 Major depressive disorder, single episode, mild: Secondary | ICD-10-CM

## 2021-04-26 MED ORDER — ESCITALOPRAM OXALATE 10 MG PO TABS: 10.0000 mg | ORAL_TABLET | Freq: Every day | ORAL | 2 refills | Status: DC

## 2021-05-02 ENCOUNTER — Encounter: Payer: Self-pay | Admitting: Nurse Practitioner

## 2021-05-02 ENCOUNTER — Other Ambulatory Visit: Payer: Self-pay

## 2021-05-02 ENCOUNTER — Ambulatory Visit (INDEPENDENT_AMBULATORY_CARE_PROVIDER_SITE_OTHER): Payer: Medicaid Other | Admitting: Nurse Practitioner

## 2021-05-02 VITALS — BP 110/74 | HR 98 | Temp 98.2°F | Ht 65.0 in | Wt 343.2 lb

## 2021-05-02 DIAGNOSIS — Z1159 Encounter for screening for other viral diseases: Secondary | ICD-10-CM | POA: Diagnosis not present

## 2021-05-02 DIAGNOSIS — Z124 Encounter for screening for malignant neoplasm of cervix: Secondary | ICD-10-CM

## 2021-05-02 DIAGNOSIS — E559 Vitamin D deficiency, unspecified: Secondary | ICD-10-CM | POA: Diagnosis not present

## 2021-05-02 DIAGNOSIS — Z13228 Encounter for screening for other metabolic disorders: Secondary | ICD-10-CM | POA: Diagnosis not present

## 2021-05-02 DIAGNOSIS — F419 Anxiety disorder, unspecified: Secondary | ICD-10-CM | POA: Diagnosis not present

## 2021-05-02 DIAGNOSIS — Z6841 Body Mass Index (BMI) 40.0 and over, adult: Secondary | ICD-10-CM | POA: Diagnosis not present

## 2021-05-02 DIAGNOSIS — Z0000 Encounter for general adult medical examination without abnormal findings: Secondary | ICD-10-CM

## 2021-05-02 DIAGNOSIS — Z23 Encounter for immunization: Secondary | ICD-10-CM | POA: Diagnosis not present

## 2021-05-02 DIAGNOSIS — F320 Major depressive disorder, single episode, mild: Secondary | ICD-10-CM

## 2021-05-02 DIAGNOSIS — Z01419 Encounter for gynecological examination (general) (routine) without abnormal findings: Secondary | ICD-10-CM

## 2021-05-02 MED ORDER — ESCITALOPRAM OXALATE 10 MG PO TABS: 10.0000 mg | ORAL_TABLET | Freq: Every day | ORAL | 2 refills | Status: DC

## 2021-05-02 MED ORDER — BUPROPION HCL ER (XL) 150 MG PO TB24: 150.0000 mg | ORAL_TABLET | ORAL | 2 refills | Status: DC

## 2021-05-02 MED ORDER — HPV 9-VALENT RECOMB VACCINE IM SUSP: 0.5000 mL | Freq: Once | INTRAMUSCULAR | 0 refills | Status: AC

## 2021-05-02 MED ORDER — VITAMIN D (ERGOCALCIFEROL) 1.25 MG (50000 UNIT) PO CAPS: 50000.0000 [IU] | ORAL_CAPSULE | ORAL | 1 refills | Status: DC

## 2021-05-02 NOTE — Patient Instructions (Addendum)
Health Maintenance, Female °Adopting a healthy lifestyle and getting preventive care are important in promoting health and wellness. Ask your health care provider about: °The right schedule for you to have regular tests and exams. °Things you can do on your own to prevent diseases and keep yourself healthy. °What should I know about diet, weight, and exercise? °Eat a healthy diet ° °Eat a diet that includes plenty of vegetables, fruits, low-fat dairy products, and lean protein. °Do not eat a lot of foods that are high in solid fats, added sugars, or sodium. °Maintain a healthy weight °Body mass index (BMI) is used to identify weight problems. It estimates body fat based on height and weight. Your health care provider can help determine your BMI and help you achieve or maintain a healthy weight. °Get regular exercise °Get regular exercise. This is one of the most important things you can do for your health. Most adults should: °Exercise for at least 150 minutes each week. The exercise should increase your heart rate and make you sweat (moderate-intensity exercise). °Do strengthening exercises at least twice a week. This is in addition to the moderate-intensity exercise. °Spend less time sitting. Even light physical activity can be beneficial. °Watch cholesterol and blood lipids °Have your blood tested for lipids and cholesterol at 27 years of age, then have this test every 5 years. °Have your cholesterol levels checked more often if: °Your lipid or cholesterol levels are high. °You are older than 27 years of age. °You are at high risk for heart disease. °What should I know about cancer screening? °Depending on your health history and family history, you may need to have cancer screening at various ages. This may include screening for: °Breast cancer. °Cervical cancer. °Colorectal cancer. °Skin cancer. °Lung cancer. °What should I know about heart disease, diabetes, and high blood pressure? °Blood pressure and heart  disease °High blood pressure causes heart disease and increases the risk of stroke. This is more likely to develop in people who have high blood pressure readings or are overweight. °Have your blood pressure checked: °Every 3-5 years if you are 18-39 years of age. °Every year if you are 40 years old or older. °Diabetes °Have regular diabetes screenings. This checks your fasting blood sugar level. Have the screening done: °Once every three years after age 40 if you are at a normal weight and have a low risk for diabetes. °More often and at a younger age if you are overweight or have a high risk for diabetes. °What should I know about preventing infection? °Hepatitis B °If you have a higher risk for hepatitis B, you should be screened for this virus. Talk with your health care provider to find out if you are at risk for hepatitis B infection. °Hepatitis C °Testing is recommended for: °Everyone born from 1945 through 1965. °Anyone with known risk factors for hepatitis C. °Sexually transmitted infections (STIs) °Get screened for STIs, including gonorrhea and chlamydia, if: °You are sexually active and are younger than 27 years of age. °You are older than 27 years of age and your health care provider tells you that you are at risk for this type of infection. °Your sexual activity has changed since you were last screened, and you are at increased risk for chlamydia or gonorrhea. Ask your health care provider if you are at risk. °Ask your health care provider about whether you are at high risk for HIV. Your health care provider may recommend a prescription medicine to help prevent HIV   infection. If you choose to take medicine to prevent HIV, you should first get tested for HIV. You should then be tested every 3 months for as long as you are taking the medicine. Pregnancy If you are about to stop having your period (premenopausal) and you may become pregnant, seek counseling before you get pregnant. Take 400 to 800  micrograms (mcg) of folic acid every day if you become pregnant. Ask for birth control (contraception) if you want to prevent pregnancy. Osteoporosis and menopause Osteoporosis is a disease in which the bones lose minerals and strength with aging. This can result in bone fractures. If you are 47 years old or older, or if you are at risk for osteoporosis and fractures, ask your health care provider if you should: Be screened for bone loss. Take a calcium or vitamin D supplement to lower your risk of fractures. Be given hormone replacement therapy (HRT) to treat symptoms of menopause. Follow these instructions at home: Alcohol use Do not drink alcohol if: Your health care provider tells you not to drink. You are pregnant, may be pregnant, or are planning to become pregnant. If you drink alcohol: Limit how much you have to: 0-1 drink a day. Know how much alcohol is in your drink. In the [place] one drink equals one 12 oz bottle of beer (355 mL), one 5 oz glass of wine (148 mL), or one 1 oz glass of hard liquor (44 mL). Lifestyle Do not use any products that contain nicotine or tobacco. These products include cigarettes, chewing tobacco, and vaping devices, such as e-cigarettes. If you need help quitting, ask your health care provider. Do not use street drugs. Do not share needles. Ask your health care provider for help if you need support or information about quitting drugs. General instructions Schedule regular health, dental, and eye exams. Stay current with your vaccines. Tell your health care provider if: You often feel depressed. You have ever been abused or do not feel safe at home. Summary Adopting a healthy lifestyle and getting preventive care are important in promoting health and wellness. Follow your health care provider's instructions about healthy diet, exercising, and getting tested or screened for diseases. Follow your health care provider's instructions on monitoring your  cholesterol and blood pressure. This information is not intended to replace advice given to you by your health care provider. Make sure you discuss any questions you have with your health care provider. Document Revised: 07/25/2020 Document Reviewed: 07/25/2020 Elsevier Patient Education  2022 [employer]  Exercising to Lose Weight Getting regular exercise is important for everyone. It is especially important if you are overweight. Being overweight increases your risk of heart disease, stroke, diabetes, high blood pressure, and several types of cancer. Exercising, and reducing the calories you consume, can help you lose weight and improve fitness and health. Exercise can be moderate or vigorous intensity. To lose weight, most people need to do a certain amount of moderate or vigorous-intensity exercise each week. How can exercise affect me? You lose weight when you exercise enough to burn more calories than you eat. Exercise also reduces body fat and builds muscle. The more muscle you have, the more calories you burn. Exercise also: Improves mood. Reduces stress and tension. Improves your overall fitness, flexibility, and endurance. Increases bone strength. Moderate-intensity exercise Moderate-intensity exercise is any activity that gets you moving enough to burn at least three times more energy (calories) than if you were sitting. Examples of moderate exercise include: Walking a mile  in 15 minutes. Doing light yard work. Biking at an easy pace. Most people should get at least 150 minutes of moderate-intensity exercise a week to maintain their body weight. Vigorous-intensity exercise Vigorous-intensity exercise is any activity that gets you moving enough to burn at least six times more calories than if you were sitting. When you exercise at this intensity, you should be working hard enough that you are not able to carry on a conversation. Examples of vigorous exercise  include: Running. Playing a team sport, such as football, basketball, and soccer. Jumping rope. Most people should get at least 75 minutes a week of vigorous exercise to maintain their body weight. What actions can I take to lose weight? The amount of exercise you need to lose weight depends on: Your age. The type of exercise. Any health conditions you have. Your overall physical ability. Talk to your health care provider about how much exercise you need and what types of activities are safe for you. Nutrition  Make changes to your diet as told by your health care provider or diet and nutrition specialist (dietitian). This may include: Eating fewer calories. Eating more protein. Eating less unhealthy fats. Eating a diet that includes fresh fruits and vegetables, whole grains, low-fat dairy products, and lean protein. Avoiding foods with added fat, salt, and sugar. Drink plenty of water while you exercise to prevent dehydration or heat stroke. Activity Choose an activity that you enjoy and set realistic goals. Your health care provider can help you make an exercise plan that works for you. Exercise at a moderate or vigorous intensity most days of the week. The intensity of exercise may vary from person to person. You can tell how intense a workout is for you by paying attention to your breathing and heartbeat. Most people will notice their breathing and heartbeat get faster with more intense exercise. Do resistance training twice each week, such as: Push-ups. Sit-ups. Lifting weights. Using resistance bands. Getting short amounts of exercise can be just as helpful as long, structured periods of exercise. If you have trouble finding time to exercise, try doing these things as part of your daily routine: Get up, stretch, and walk around every 30 minutes throughout the day. Go for a walk during your lunch break. Park your car farther away from your destination. If you take public  transportation, get off one stop early and walk the rest of the way. Make phone calls while standing up and walking around. Take the stairs instead of elevators or escalators. Wear comfortable clothes and shoes with good support. Do not exercise so much that you hurt yourself, feel dizzy, or get very short of breath. Where to find more information [place] Department of Health and Human Services: ThisPath.fi Centers for Disease Control and Prevention: [employer]Ex[employer].br Contact a health care provider: Before starting a new exercise program. If you have questions or concerns about your weight. If you have a medical problem that keeps you from exercising. Get help right away if: You have any of the following while exercising: Injury. Dizziness. Difficulty breathing or shortness of breath that does not go away when you stop exercising. Chest pain. Rapid heartbeat. These symptoms may represent a serious problem that is an emergency. Do not wait to see if the symptoms will go away. Get medical help right away. Call your local emergency services (911 in the [place] Do not drive yourself to the hospital. Summary Getting regular exercise is especially important if you are overweight. Being overweight increases your  risk of heart disease, stroke, diabetes, high blood pressure, and several types of cancer. Losing weight happens when you burn more calories than you eat. Reducing the amount of calories you eat, and getting regular moderate or vigorous exercise each week, helps you lose weight. This information is not intended to replace advice given to you by your health care provider. Make sure you discuss any questions you have with your health care provider. Document Revised: 05/01/2020 Document Reviewed: 05/01/2020 Elsevier Patient Education  2022 [employer].  Take baby steps start with walking during commercial breaks. Increasing to a goal of 150 minutes per week. Increase water intake to 64 oz daily  and cut out sugary drinks.

## 2021-05-02 NOTE — Progress Notes (Signed)
I,Victoria T Hamilton,acting as a Education administrator for Minette Brine, FNP.,have documented all relevant documentation on the behalf of Minette Brine, FNP,as directed by  Minette Brine, FNP while in the presence of Minette Brine, [place].   This visit occurred during the SARS-CoV-2 public health emergency.  Safety protocols were in place, including screening questions prior to the visit, additional usage of staff PPE, and extensive cleaning of exam room while observing appropriate contact time as indicated for disinfecting solutions.  Subjective:     Patient ID: Catherine Cochran , female    DOB: 22-Oct-1995 , 26 y.o.   MRN: [id number]   Chief Complaint  Patient presents with   Annual Exam    HPI  HM. Pt has no other questions or concerns at the moment. She currently does not have GYN.    Wt Readings from Last 3 Encounters: 05/02/21 : (!) 343 lb 3.2 oz (155.7 kg) 02/02/21 : (!) 318 lb (144.2 kg) 08/25/18 : 292 lb (132.5 kg)      Past Medical History:  Diagnosis Date   Anemia    Hypertension    Gestational HTN 2018     Family History  Problem Relation Age of Onset   Diabetes Mother    HIV Mother    Asthma Mother    Asthma Brother      Current Outpatient Medications:    buPROPion (WELLBUTRIN XL) 150 MG 24 hr tablet, Take 1 tablet (150 mg total) by mouth every morning., Disp: 30 tablet, Rfl: 2   Vitamin D, Ergocalciferol, (DRISDOL) 1.25 MG (50000 UNIT) CAPS capsule, Take 1 capsule (50,000 Units total) by mouth every 7 (seven) days., Disp: 12 capsule, Rfl: 1   No Known Allergies    The patient states she uses IUD for birth control. Last LMP was No LMP recorded. (Menstrual status: IUD).. Negative for Dysmenorrhea and Negative for Menorrhagia. Negative for: breast discharge, breast lump(s), breast pain and breast self exam. Associated symptoms include abnormal vaginal bleeding. Pertinent negatives include abnormal bleeding (hematology), anxiety, decreased libido, depression, difficulty falling  sleep, dyspareunia, history of infertility, nocturia, sexual dysfunction, sleep disturbances, urinary incontinence, urinary urgency, vaginal discharge and vaginal itching. Diet regular. The patient states her exercise level is minimal - no barriers, will start off but hard to keep going. She works Saturday - Tuesday 8a-6pm. She has 2 children.   The patient's tobacco use is:  Social History   Tobacco Use  Smoking Status Never  Smokeless Tobacco Never  . She has been exposed to passive smoke. The patient's alcohol use is:  Social History   Substance and Sexual Activity  Alcohol Use Not Currently   Additional information: Last pap 03/2018, next one scheduled for /2023.    Review of Systems  Constitutional: Negative.   HENT: Negative.    Eyes: Negative.   Respiratory: Negative.    Cardiovascular: Negative.  Negative for chest pain, palpitations and leg swelling.  Gastrointestinal: Negative.   Endocrine: Negative.   Genitourinary: Negative.   Musculoskeletal: Negative.   Skin: Negative.   Allergic/Immunologic: Negative.   Neurological: Negative.   Hematological: Negative.   Psychiatric/Behavioral: Negative.      Today's Vitals   05/02/21 1424  BP: 110/74  Pulse: 98  Temp: 98.2 F (36.8 C)  Weight: (!) 343 lb 3.2 oz (155.7 kg)  Height: '5\' 5"'  (1.651 m)   Body mass index is 57.11 kg/m.  Wt Readings from Last 3 Encounters:  05/02/21 (!) 343 lb 3.2 oz (155.7 kg)  02/02/21 (!) 318  lb (144.2 kg)  08/25/18 292 lb (132.5 kg)    Objective:  Physical Exam Vitals reviewed.  Constitutional:      General: She is not in acute distress.    Appearance: Normal appearance. She is well-developed. She is obese.  HENT:     Head: Normocephalic and atraumatic.     Right Ear: Hearing, tympanic membrane, ear canal and external ear normal. There is no impacted cerumen.     Left Ear: Hearing, tympanic membrane, ear canal and external ear normal. There is no impacted cerumen.     Nose:      Comments: Deferred - masked    Mouth/Throat:     Comments: Deferred - masked Eyes:     General: Lids are normal.     Extraocular Movements: Extraocular movements intact.     Conjunctiva/sclera: Conjunctivae normal.     Pupils: Pupils are equal, round, and reactive to light.     Funduscopic exam:    Right eye: No papilledema.        Left eye: No papilledema.  Neck:     Thyroid: No thyroid mass.     Vascular: No carotid bruit.  Cardiovascular:     Rate and Rhythm: Normal rate and regular rhythm.     Pulses: Normal pulses.     Heart sounds: Normal heart sounds. No murmur heard. Pulmonary:     Effort: Pulmonary effort is normal. No respiratory distress.     Breath sounds: Normal breath sounds. No wheezing.  Chest:     Chest wall: No mass.  Breasts:    Tanner Score is 5.     Right: Normal. No mass or tenderness.     Left: Normal. No mass or tenderness.  Abdominal:     General: Abdomen is flat. Bowel sounds are normal. There is no distension.     Palpations: Abdomen is soft.     Tenderness: There is no abdominal tenderness.  Genitourinary:    Rectum: Guaiac result negative.  Musculoskeletal:        General: No swelling. Normal range of motion.     Cervical back: Full passive range of motion without pain, normal range of motion and neck supple.     Right lower leg: No edema.     Left lower leg: No edema.  Lymphadenopathy:     Upper Body:     Right upper body: No supraclavicular, axillary or pectoral adenopathy.     Left upper body: No supraclavicular, axillary or pectoral adenopathy.  Skin:    General: Skin is warm and dry.     Capillary Refill: Capillary refill takes less than 2 seconds.  Neurological:     General: No focal deficit present.     Mental Status: She is alert and oriented to person, place, and time.     Cranial Nerves: No cranial nerve deficit.     Sensory: No sensory deficit.     Motor: No weakness.  Psychiatric:        Mood and Affect: Mood normal.         Behavior: Behavior normal.        Thought Content: Thought content normal.        Judgment: Judgment normal.        Assessment And Plan:     1. Encounter for annual health examination Behavior modifications discussed and diet history reviewed.   Pt will continue to exercise regularly and modify diet with low GI, plant based foods and decrease intake of  processed foods.  Recommend intake of daily multivitamin, Vitamin D, and calcium.  Recommend self breast exams monthly for preventive screenings, as well as recommend immunizations that include influenza, TDAP, and Shingles - CMP14+EGFR - CBC  2. Class 3 severe obesity due to excess calories without serious comorbidity with body mass index (BMI) of 50.0 to 59.9 in adult [facility]) Chronic Discussed healthy diet and regular exercise options  Encouraged to exercise at least 150 minutes per week with 2 days of strength training She is encouraged to strive for BMI less than 30 to decrease cardiac risk.  She had a significant weight gain since November 2022, will check for metabolic causes - Hemoglobin A1c - Insulin, random(561) - TSH - Amb Ref to Medical Weight Management  3. Encounter for gynecological examination - Ambulatory referral to Obstetrics / Gynecology  4. Encounter for hepatitis C screening test for low risk patient Will check Hepatitis C screening due to recent recommendations to screen all adults 18 years and older - Hepatitis C antibody  5. Encounter for screening for metabolic disorder - Hemoglobin A1c - Lipid panel  6. Encounter for immunization - hpv 9-valent vaccine (GARDASIL 9) SUSP injection; Inject 0.5 mLs into the muscle once for 1 dose.  Dispense: 0.5 mL; Refill: 0  7. Current mild episode of major depressive disorder, unspecified whether recurrent ([place]) Comments: She is to change from Lexapro to wellbutrin since this can help with weight loss.   8. Anxiety - buPROPion (WELLBUTRIN XL) 150 MG 24 hr tablet;  Take 1 tablet (150 mg total) by mouth every morning.  Dispense: 30 tablet; Refill: 2  9. Vitamin D deficiency Will check vitamin D level and supplement as needed.    Also encouraged to spend 15 minutes in the sun daily.  - Vitamin D 1,25 dihydroxy - Vitamin D, Ergocalciferol, (DRISDOL) 1.25 MG (50000 UNIT) CAPS capsule; Take 1 capsule (50,000 Units total) by mouth every 7 (seven) days.  Dispense: 12 capsule; Refill: 1   Patient was given opportunity to ask questions. Patient verbalized understanding of the plan and was able to repeat key elements of the plan. All questions were answered to their satisfaction.   Minette Brine, FNP   I, Minette Brine, FNP, have reviewed all documentation for this visit. The documentation on 05/02/21 for the exam, diagnosis, procedures, and orders are all accurate and complete.   THE PATIENT IS ENCOURAGED TO PRACTICE SOCIAL DISTANCING DUE TO THE COVID-19 PANDEMIC.

## 2021-05-09 LAB — CMP14+EGFR
ALT: 17 IU/L (ref 0–32)
AST: 19 IU/L (ref 0–40)
Albumin/Globulin Ratio: 1.3 (ref 1.2–2.2)
Albumin: 4 g/dL (ref 3.9–5.0)
Alkaline Phosphatase: 90 IU/L (ref 44–121)
BUN/Creatinine Ratio: 14 (ref 9–23)
BUN: 9 mg/dL (ref 6–20)
Bilirubin Total: <0.2 mg/dL (ref 0.0–1.2)
CO2: 25 mmol/L (ref 20–29)
Calcium: 9.2 mg/dL (ref 8.7–10.2)
Chloride: 100 mmol/L (ref 96–106)
Creatinine, Ser: 0.65 mg/dL (ref 0.57–1.00)
Globulin, Total: 3.1 g/dL (ref 1.5–4.5)
Glucose: 78 mg/dL (ref 70–99)
Potassium: 4.1 mmol/L (ref 3.5–5.2)
Sodium: 140 mmol/L (ref 134–144)
Total Protein: 7.1 g/dL (ref 6.0–8.5)
eGFR: 124 mL/min/{1.73_m2} (ref >59)

## 2021-05-09 LAB — CBC
Hematocrit: 39.4 % (ref 34.0–46.6)
Hemoglobin: 13 g/dL (ref 11.1–15.9)
MCH: 27.9 pg (ref 26.6–33.0)
MCHC: 33 g/dL (ref 31.5–35.7)
MCV: 85 fL (ref 79–97)
Platelets: 389 10*3/uL (ref 150–450)
RBC: 4.66 x10E6/uL (ref 3.77–5.28)
RDW: 13.6 % (ref 11.7–15.4)
WBC: 12.4 10*3/uL — ABNORMAL HIGH (ref 3.4–10.8)

## 2021-05-09 LAB — INSULIN, RANDOM: INSULIN: 26.1 u[IU]/mL — ABNORMAL HIGH (ref 2.6–24.9)

## 2021-05-09 LAB — HEPATITIS C ANTIBODY: Hep C Virus Ab: NONREACTIVE

## 2021-05-09 LAB — TSH: TSH: 2 u[IU]/mL (ref 0.450–4.500)

## 2021-05-09 LAB — LIPID PANEL
Chol/HDL Ratio: 4.7 ratio — ABNORMAL HIGH (ref 0.0–4.4)
Cholesterol, Total: 180 mg/dL (ref 100–199)
HDL: 38 mg/dL — ABNORMAL LOW (ref >39)
LDL Chol Calc (NIH): 124 mg/dL — ABNORMAL HIGH (ref 0–99)
Triglycerides: 97 mg/dL (ref 0–149)
VLDL Cholesterol Cal: 18 mg/dL (ref 5–40)

## 2021-05-09 LAB — VITAMIN D 1,25 DIHYDROXY
Vitamin D 1, 25 (OH)2 Total: 47 pg/mL
Vitamin D2 1, 25 (OH)2: 39 pg/mL
Vitamin D3 1, 25 (OH)2: <10 pg/mL

## 2021-05-09 LAB — HEMOGLOBIN A1C
Est. average glucose Bld gHb Est-mCnc: 123 mg/dL
Hgb A1c MFr Bld: 5.9 % — ABNORMAL HIGH (ref 4.8–5.6)

## 2021-05-17 DIAGNOSIS — Z419 Encounter for procedure for purposes other than remedying health state, unspecified: Secondary | ICD-10-CM | POA: Diagnosis not present

## 2021-05-24 ENCOUNTER — Other Ambulatory Visit (HOSPITAL_COMMUNITY)
Admission: RE | Admit: 2021-05-24 | Discharge: 2021-05-24 | Disposition: A | Discharge status: Home / Self Care | Payer: Medicaid Other | Source: Ambulatory Visit | Attending: Nurse Practitioner | Admitting: Nurse Practitioner

## 2021-05-24 ENCOUNTER — Ambulatory Visit (INDEPENDENT_AMBULATORY_CARE_PROVIDER_SITE_OTHER): Payer: Medicaid Other | Admitting: Nurse Practitioner

## 2021-05-24 ENCOUNTER — Encounter: Payer: Self-pay | Admitting: Nurse Practitioner

## 2021-05-24 ENCOUNTER — Ambulatory Visit (INDEPENDENT_AMBULATORY_CARE_PROVIDER_SITE_OTHER): Payer: Medicaid Other

## 2021-05-24 ENCOUNTER — Other Ambulatory Visit: Payer: Self-pay

## 2021-05-24 VITALS — BP 130/74 | HR 90 | Temp 98.6°F | Ht 65.0 in | Wt 344.0 lb

## 2021-05-24 DIAGNOSIS — F419 Anxiety disorder, unspecified: Secondary | ICD-10-CM | POA: Diagnosis not present

## 2021-05-24 DIAGNOSIS — Z23 Encounter for immunization: Secondary | ICD-10-CM | POA: Diagnosis not present

## 2021-05-24 DIAGNOSIS — Z6841 Body Mass Index (BMI) 40.0 and over, adult: Secondary | ICD-10-CM | POA: Diagnosis not present

## 2021-05-24 DIAGNOSIS — D72828 Other elevated white blood cell count: Secondary | ICD-10-CM | POA: Diagnosis not present

## 2021-05-24 DIAGNOSIS — F320 Major depressive disorder, single episode, mild: Secondary | ICD-10-CM

## 2021-05-24 DIAGNOSIS — F325 Major depressive disorder, single episode, in full remission: Secondary | ICD-10-CM | POA: Insufficient documentation

## 2021-05-24 MED ORDER — HYDROXYZINE PAMOATE 25 MG PO CAPS: 25.0000 mg | ORAL_CAPSULE | Freq: Three times a day (TID) | ORAL | 0 refills | Status: DC | PRN

## 2021-05-24 NOTE — Patient Instructions (Signed)

## 2021-05-24 NOTE — Progress Notes (Signed)
I,Tianna Badgett,acting as a Neurosurgeon for [employer], FNP.,have documented all relevant documentation on the behalf of Arnette Felts, FNP,as directed by  Arnette Felts, FNP while in the presence of Arnette Felts, FNP.  This visit occurred during the SARS-CoV-2 public health emergency.  Safety protocols were in place, including screening questions prior to the visit, additional usage of staff PPE, and extensive cleaning of exam room while observing appropriate contact time as indicated for disinfecting solutions.  Subjective:     Patient ID: Catherine Cochran , female    DOB: 22-Apr-1995 , 26 y.o.   MRN: [id number]   Chief Complaint  Patient presents with   Anxiety   Diabetes    HPI  She feels the wellbutrin has been effective except for when she is sitting at home then she will think more. Feels like her mood has improved.  She has been walking more - 3 days a week. Denies taking magnesium.  She has been trying to drink more water.  Wt Readings from Last 3 Encounters: 05/24/21 : (!) 344 lb (156 kg) 05/02/21 : (!) 343 lb 3.2 oz (155.7 kg) 02/02/21 : (!) 318 lb (144.2 kg)    Anxiety Presents for follow-up visit. Patient reports no chest pain, dizziness or insomnia. Nighttime awakenings: none.      Past Medical History:  Diagnosis Date   Anemia    Hypertension    Gestational HTN 2018     Family History  Problem Relation Age of Onset   Diabetes Mother    HIV Mother    Asthma Mother    Asthma Brother      Current Outpatient Medications:    buPROPion (WELLBUTRIN XL) 150 MG 24 hr tablet, Take 1 tablet (150 mg total) by mouth every morning., Disp: 30 tablet, Rfl: 2   hydrOXYzine (VISTARIL) 25 MG capsule, Take 1 capsule (25 mg total) by mouth 3 (three) times daily as needed., Disp: 30 capsule, Rfl: 0   Vitamin D, Ergocalciferol, (DRISDOL) 1.25 MG (50000 UNIT) CAPS capsule, Take 1 capsule (50,000 Units total) by mouth every 7 (seven) days., Disp: 12 capsule, Rfl: 1   No Known  Allergies   Review of Systems  Constitutional: Negative.   Respiratory: Negative.    Cardiovascular: Negative.  Negative for chest pain.  Gastrointestinal: Negative.   Neurological: Negative.  Negative for dizziness.  Psychiatric/Behavioral:  The patient does not have insomnia.     Today's Vitals   05/24/21 0941  BP: 130/74  Pulse: 90  Temp: 98.6 F (37 C)  TempSrc: Oral  Weight: (!) 344 lb (156 kg)  Height: 5\' 5"  (1.651 m)   Body mass index is 57.24 kg/m.   Objective:  Physical Exam Vitals reviewed.  Constitutional:      General: She is not in acute distress.    Appearance: Normal appearance. She is well-developed. She is obese.  Cardiovascular:     Rate and Rhythm: Normal rate and regular rhythm.     Pulses: Normal pulses.     Heart sounds: Normal heart sounds. No murmur heard. Pulmonary:     Effort: Pulmonary effort is normal. No respiratory distress.     Breath sounds: Normal breath sounds. No wheezing.  Skin:    General: Skin is warm and dry.     Capillary Refill: Capillary refill takes less than 2 seconds.  Neurological:     General: No focal deficit present.     Mental Status: She is alert and oriented to person, place, and time.  Cranial Nerves: No cranial nerve deficit.  Psychiatric:        Attention and Perception: Attention normal.        Mood and Affect: Mood normal.        Speech: Speech normal.        Behavior: Behavior normal.        Thought Content: Thought content normal.        Cognition and Memory: Cognition normal.        Judgment: Judgment normal.        Assessment And Plan:     1. Current mild episode of major depressive disorder, unspecified whether recurrent (HCC) Comments: Improving, Continue current medications.   2. Anxiety Comments: Improving, continue current medications - hydrOXYzine (VISTARIL) 25 MG capsule; Take 1 capsule (25 mg total) by mouth 3 (three) times daily as needed.  Dispense: 30 capsule; Refill: 0  3. Other  elevated white blood cell (WBC) count Comments: WBCs have been elevated, will refer to hematology for further evaluation - Urine cytology ancillary only - POCT urinalysis dipstick - Ambulatory referral to Hematology / Oncology  4. Body mass index (BMI) 50.0-59.9, adult (HCC)   Discussed labs during visit, encouraged to eat a diet low in carbs and sugar and increase physical activity. Goal to exercise 150 minutes per week with at least 2 days of strength training Encouraged to park further when at the store, take stairs instead of elevators and to walk in place during commercials. Increase water intake to at least one gallon of water daily.   She had her moderna covid #1   Patient was given opportunity to ask questions. Patient verbalized understanding of the plan and was able to repeat key elements of the plan. All questions were answered to their satisfaction.  Arnette Felts, FNP   I, Arnette Felts, FNP, have reviewed all documentation for this visit. The documentation on 05/24/21 for the exam, diagnosis, procedures, and orders are all accurate and complete.   IF YOU HAVE BEEN REFERRED TO A SPECIALIST, IT MAY TAKE 1-2 WEEKS TO SCHEDULE/PROCESS THE REFERRAL. IF YOU HAVE NOT HEARD FROM US/SPECIALIST IN TWO WEEKS, PLEASE GIVE [place] A CALL AT [phone].   THE PATIENT IS ENCOURAGED TO PRACTICE SOCIAL DISTANCING DUE TO THE COVID-19 PANDEMIC.

## 2021-05-26 LAB — URINE CYTOLOGY ANCILLARY ONLY
Bacterial Vaginitis-Urine: NEGATIVE
Candida Urine: NEGATIVE
Chlamydia: NEGATIVE
Comment: NEGATIVE
Comment: NEGATIVE
Comment: NORMAL
Neisseria Gonorrhea: NEGATIVE
Trichomonas: NEGATIVE

## 2021-05-30 ENCOUNTER — Ambulatory Visit: Payer: Medicaid Other | Admitting: Nurse Practitioner

## 2021-06-02 DIAGNOSIS — Z6841 Body Mass Index (BMI) 40.0 and over, adult: Secondary | ICD-10-CM | POA: Insufficient documentation

## 2021-06-07 ENCOUNTER — Telehealth: Payer: Self-pay | Admitting: Physician Assistant

## 2021-06-07 NOTE — Telephone Encounter (Signed)
Scheduled appt per 3/17 referral. Pt is aware of appt date and time. Pt is aware to arrive 15 mins prior to appt time and to bring and updated insurance card. Pt is aware of appt location.   ?

## 2021-06-13 ENCOUNTER — Ambulatory Visit: Payer: Medicaid Other

## 2021-06-17 DIAGNOSIS — Z419 Encounter for procedure for purposes other than remedying health state, unspecified: Secondary | ICD-10-CM | POA: Diagnosis not present

## 2021-06-21 ENCOUNTER — Inpatient Hospital Stay: Payer: Medicaid Other | Attending: Physician Assistant | Admitting: Physician Assistant

## 2021-06-21 ENCOUNTER — Ambulatory Visit: Payer: Medicaid Other | Admitting: Nurse Practitioner

## 2021-06-21 ENCOUNTER — Other Ambulatory Visit: Payer: Self-pay

## 2021-06-21 ENCOUNTER — Inpatient Hospital Stay: Payer: Medicaid Other

## 2021-06-21 ENCOUNTER — Encounter: Payer: Self-pay | Admitting: Physician Assistant

## 2021-06-21 VITALS — BP 126/91 | HR 118 | Temp 97.3°F | Resp 20 | Wt 344.9 lb

## 2021-06-21 DIAGNOSIS — F17210 Nicotine dependence, cigarettes, uncomplicated: Secondary | ICD-10-CM | POA: Diagnosis not present

## 2021-06-21 DIAGNOSIS — D72829 Elevated white blood cell count, unspecified: Secondary | ICD-10-CM | POA: Insufficient documentation

## 2021-06-21 DIAGNOSIS — I10 Essential (primary) hypertension: Secondary | ICD-10-CM | POA: Insufficient documentation

## 2021-06-21 DIAGNOSIS — Z803 Family history of malignant neoplasm of breast: Secondary | ICD-10-CM | POA: Insufficient documentation

## 2021-06-21 DIAGNOSIS — D729 Disorder of white blood cells, unspecified: Secondary | ICD-10-CM

## 2021-06-21 LAB — CMP (CANCER CENTER ONLY)
ALT: 21 U/L (ref 0–44)
AST: 18 U/L (ref 15–41)
Albumin: 3.8 g/dL (ref 3.5–5.0)
Alkaline Phosphatase: 68 U/L (ref 38–126)
Anion gap: 5 (ref 5–15)
BUN: 12 mg/dL (ref 6–20)
CO2: 28 mmol/L (ref 22–32)
Calcium: 8.8 mg/dL — ABNORMAL LOW (ref 8.9–10.3)
Chloride: 107 mmol/L (ref 98–111)
Creatinine: 0.76 mg/dL (ref 0.44–1.00)
GFR, Estimated: >60 mL/min (ref >60)
Glucose, Bld: 107 mg/dL — ABNORMAL HIGH (ref 70–99)
Potassium: 3.7 mmol/L (ref 3.5–5.1)
Sodium: 140 mmol/L (ref 135–145)
Total Bilirubin: 0.3 mg/dL (ref 0.3–1.2)
Total Protein: 7.4 g/dL (ref 6.5–8.1)

## 2021-06-21 LAB — CBC WITH DIFFERENTIAL (CANCER CENTER ONLY)
Abs Immature Granulocytes: 0.05 10*3/uL (ref 0.00–0.07)
Basophils Absolute: 0.1 10*3/uL (ref 0.0–0.1)
Basophils Relative: 1 %
Eosinophils Absolute: 0.2 10*3/uL (ref 0.0–0.5)
Eosinophils Relative: 2 %
HCT: 39.8 % (ref 36.0–46.0)
Hemoglobin: 12.5 g/dL (ref 12.0–15.0)
Immature Granulocytes: 1 %
Lymphocytes Relative: 25 %
Lymphs Abs: 2.7 10*3/uL (ref 0.7–4.0)
MCH: 27.5 pg (ref 26.0–34.0)
MCHC: 31.4 g/dL (ref 30.0–36.0)
MCV: 87.7 fL (ref 80.0–100.0)
Monocytes Absolute: 0.6 10*3/uL (ref 0.1–1.0)
Monocytes Relative: 5 %
Neutro Abs: 7.5 10*3/uL (ref 1.7–7.7)
Neutrophils Relative %: 66 %
Platelet Count: 347 10*3/uL (ref 150–400)
RBC: 4.54 MIL/uL (ref 3.87–5.11)
RDW: 13.9 % (ref 11.5–15.5)
WBC Count: 11.1 10*3/uL — ABNORMAL HIGH (ref 4.0–10.5)
nRBC: 0 % (ref 0.0–0.2)

## 2021-06-21 LAB — SEDIMENTATION RATE: Sed Rate: 40 mm/hr — ABNORMAL HIGH (ref 0–22)

## 2021-06-21 LAB — LACTATE DEHYDROGENASE: LDH: 168 U/L (ref 98–192)

## 2021-06-21 LAB — C-REACTIVE PROTEIN: CRP: 1.4 mg/dL — ABNORMAL HIGH (ref <1.0)

## 2021-06-21 LAB — SAVE SMEAR(SSMR), FOR PROVIDER SLIDE REVIEW

## 2021-06-21 NOTE — Progress Notes (Signed)
?[name] ?Telephone:([phone]   [phone] ? ?INITIAL CONSULT NOTE ? ?Patient Care Team: ?Minette Brine, FNP as PCP - General (General Practice) ? ?CHIEF COMPLAINTS/PURPOSE OF CONSULTATION:  ?Leukocytosis ? ?HISTORY OF PRESENTING ILLNESS:  ?Catherine Cochran 26 y.o. female with medical history significant for anxiety presents to the clinic for initial evaluation for leukocytosis.  ? ?On review of the previous records, Catherine Cochran has evidence of leukocytosis as far back as March 2020. Most recent labs from 06/21/2021 included WBC 11.1, Hgb 12.5 and Plt 347.  ? ?At today's visit, Catherine Cochran reports that her energy and appetite are stable and unchanged over the last several months. She is able to complete all her daily activities on her own. She denies any nausea, vomiting or abdominal pain. Her bowel habits are unchanged without any recurrent episodes of constipation or diarrhea. She denies easy bruising or signs of bleeding. She denies fevers, chills, night sweats, shortness of breath, chest pain or cough. She has no other complaints. Rest of the 10 point ROS is below.  ? ?MEDICAL HISTORY:  ?Past Medical History:  ?Diagnosis Date  ? Anemia   ? Hypertension   ? Gestational HTN 2018  ? ? ?SURGICAL HISTORY: ?Past Surgical History:  ?Procedure Laterality Date  ? CESAREAN SECTION  2017, 2018  ? CESAREAN SECTION N/A 08/25/2018  ? Procedure: CESAREAN SECTION;  Surgeon: Sloan Leiter, MD;  Location: [facility] LD ORS;  Service: Obstetrics;  Laterality: N/A;  ? TONSILLECTOMY    ? 2nd grade  ? WISDOM TOOTH EXTRACTION    ? ? ?SOCIAL HISTORY: ?Social History  ? ?Socioeconomic History  ? Marital status: Single  ?  Spouse name: Not on file  ? Number of children: 2  ? Years of education: 66  ? Highest education level: High school graduate  ?Occupational History  ? Occupation: Scientist, water quality  ?  Employer: [employer]  ?Tobacco Use  ? Smoking status: Never  ? Smokeless tobacco: Never  ?Vaping Use  ? Vaping Use: Never used   ?Substance and Sexual Activity  ? Alcohol use: Not Currently  ? Drug use: Never  ? Sexual activity: Yes  ?Other Topics Concern  ? Not on file  ?Social History Narrative  ? Not on file  ? ?Social Determinants of Health  ? ?Financial Resource Strain: Not on file  ?Food Insecurity: Not on file  ?Transportation Needs: Not on file  ?Physical Activity: Not on file  ?Stress: Not on file  ?Social Connections: Not on file  ?Intimate Partner Violence: Not on file  ? ? ?FAMILY HISTORY: ?Family History  ?Problem Relation Age of Onset  ? Diabetes Mother   ? HIV Mother   ? Asthma Mother   ? Asthma Brother   ? Breast cancer Maternal Grandmother   ? ? ?ALLERGIES:  has No Known Allergies. ? ?MEDICATIONS:  ?Current Outpatient Medications  ?Medication Sig Dispense Refill  ? buPROPion (WELLBUTRIN XL) 150 MG 24 hr tablet Take 1 tablet (150 mg total) by mouth every morning. 30 tablet 2  ? hydrOXYzine (VISTARIL) 25 MG capsule Take 1 capsule (25 mg total) by mouth 3 (three) times daily as needed. 30 capsule 0  ? Vitamin D, Ergocalciferol, (DRISDOL) 1.25 MG (50000 UNIT) CAPS capsule Take 1 capsule (50,000 Units total) by mouth every 7 (seven) days. 12 capsule 1  ? ?No current facility-administered medications for this visit.  ? ? ?REVIEW OF SYSTEMS:   ?Constitutional: ( - ) fevers, ( - )  chills , ( - )  night sweats ?Eyes: ( - ) blurriness of vision, ( - ) double vision, ( - ) watery eyes ?Ears, nose, mouth, throat, and face: ( - ) mucositis, ( - ) sore throat ?Respiratory: ( - ) cough, ( - ) dyspnea, ( - ) wheezes ?Cardiovascular: ( - ) palpitation, ( - ) chest discomfort, ( - ) lower extremity swelling ?Gastrointestinal:  ( - ) nausea, ( - ) heartburn, ( - ) change in bowel habits ?Skin: ( - ) abnormal skin rashes ?Lymphatics: ( - ) new lymphadenopathy, ( - ) easy bruising ?Neurological: ( - ) numbness, ( - ) tingling, ( - ) new weaknesses ?Behavioral/Psych: ( - ) mood change, ( - ) new changes  ?All other systems were reviewed with the  patient and are negative. ? ?PHYSICAL EXAMINATION: ?ECOG PERFORMANCE STATUS: 0 - Asymptomatic ? ?Vitals:  ? 06/21/21 1106  ?BP: (!) 126/91  ?Pulse: (!) 118  ?Resp: 20  ?Temp: (!) 97.3 ?F (36.3 ?C)  ?SpO2: 100%  ? ?Filed Weights  ? 06/21/21 1106  ?Weight: (!) 344 lb 14.4 oz (156.4 kg)  ? ? ?GENERAL: well appearing female in NAD  ?SKIN: skin color, texture, turgor are normal, no rashes or significant lesions ?EYES: conjunctiva are pink and non-injected, sclera clear ?OROPHARYNX: no exudate, no erythema; lips, buccal mucosa, and tongue normal  ?NECK: supple, non-tender ?LYMPH:  no palpable lymphadenopathy in the cervical or supraclavicular lymph nodes.  ?LUNGS: clear to auscultation and percussion with normal breathing effort ?HEART: regular rate & rhythm and no murmurs and no lower extremity edema ?ABDOMEN: soft, non-tender, non-distended, normal bowel sounds ?Musculoskeletal: no cyanosis of digits and no clubbing  ?PSYCH: alert & oriented x 3, fluent speech ?NEURO: no focal motor/sensory deficits ? ?LABORATORY DATA:  ?I have reviewed the data as listed ? ?  Latest Ref Rng & Units 06/21/2021  ? 12:15 PM 05/02/2021  ?  3:22 PM 11/22/2020  ? 12:54 PM  ?CBC  ?WBC 4.0 - 10.5 K/uL 11.1   12.4   13.7    ?Hemoglobin 12.0 - 15.0 g/dL 12.5   13.0   14.0    ?Hematocrit 36.0 - 46.0 % 39.8   39.4   43.5    ?Platelets 150 - 400 K/uL 347   389   347    ? ? ? ?  Latest Ref Rng & Units 05/02/2021  ?  3:22 PM 11/22/2020  ? 12:54 PM 08/26/2018  ?  5:47 AM  ?CMP  ?Glucose 70 - 99 mg/dL 78   102     ?BUN 6 - 20 mg/dL 9   8     ?Creatinine 0.57 - 1.00 mg/dL 0.65   0.83   0.55    ?Sodium 134 - 144 mmol/L 140   138     ?Potassium 3.5 - 5.2 mmol/L 4.1   4.6     ?Chloride 96 - 106 mmol/L 100   108     ?CO2 20 - 29 mmol/L 25   26     ?Calcium 8.7 - 10.2 mg/dL 9.2   9.7     ?Total Protein 6.0 - 8.5 g/dL 7.1      ?Total Bilirubin 0.0 - 1.2 mg/dL <0.2      ?Alkaline Phos 44 - 121 IU/L 90      ?AST 0 - 40 IU/L 19      ?ALT 0 - 32 IU/L 17       ? ? ?ASSESSMENT & PLAN ?Catherine Cochran is  a 26 y.o. female who presents for initial evaluation for leukocytosis. We reviewed possible etiologies including infectious process, inflammatory process, smoking, obstructive sleep apnea and bone marrow disorders.  ? ?Patient reports recent history of smoking for 2-3 months but otherwise no known risk factors. It is reassuring that WBC has been mildly elevated and improving since September 2022. The recommendation is to proceed with serologic workup today. If there is not underlying cause identified and there is persistent leukocytosis, we recommend an evaluation to rule out obstructive sleep apnea.  ? ?#Leukocytosis/neutrophilia: ?--Labs today to check CBC, CMP, LDH, ESR, CRP, Save smear ?--If there is persistent leukocytosis, recommend a sleep study to evaluate for OSA ?--No indication for bone marrow biopsy at this time.  ?--RTC in 6 months with repeat labs.  ? ? ?Orders Placed This Encounter  ?Procedures  ? CBC with Differential ([facility] Only)  ?  Standing Status:   Future  ?  Number of Occurrences:   1  ?  Standing Expiration Date:   06/22/2022  ? CMP ([place] only)  ?  Standing Status:   Future  ?  Number of Occurrences:   1  ?  Standing Expiration Date:   06/22/2022  ? Lactate dehydrogenase (LDH)  ?  Standing Status:   Future  ?  Number of Occurrences:   1  ?  Standing Expiration Date:   06/21/2022  ? Save Smear [facility])  ?  Standing Status:   Future  ?  Number of Occurrences:   1  ?  Standing Expiration Date:   06/22/2022  ? Sedimentation rate  ?  Standing Status:   Future  ?  Number of Occurrences:   1  ?  Standing Expiration Date:   06/21/2022  ? C-reactive protein  ?  Standing Status:   Future  ?  Number of Occurrences:   1  ?  Standing Expiration Date:   06/21/2022  ? ? ?All questions were answered. The patient knows to call the clinic with any problems, questions or concerns. ? ?I have spent a total of 60 minutes minutes of face-to-face and non-face-to-face time,  preparing to see the patient, obtaining and/or reviewing separately obtained history, performing a medically appropriate examination, counseling and educating the patient, ordering tests, documenting clinical information in

## 2021-06-22 ENCOUNTER — Telehealth: Payer: Self-pay | Admitting: Hematology and Oncology

## 2021-06-22 NOTE — Telephone Encounter (Signed)
Scheduled per 4/5 los, pt has been called and confirmed  ?

## 2021-06-26 ENCOUNTER — Encounter: Payer: Self-pay | Admitting: Physician Assistant

## 2021-07-12 ENCOUNTER — Ambulatory Visit: Payer: Medicaid Other | Admitting: Nurse Practitioner

## 2021-07-17 DIAGNOSIS — Z419 Encounter for procedure for purposes other than remedying health state, unspecified: Secondary | ICD-10-CM | POA: Diagnosis not present

## 2021-07-18 ENCOUNTER — Ambulatory Visit (INDEPENDENT_AMBULATORY_CARE_PROVIDER_SITE_OTHER): Payer: Medicaid Other | Admitting: Clinical

## 2021-07-18 DIAGNOSIS — F330 Major depressive disorder, recurrent, mild: Secondary | ICD-10-CM
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
## 2021-07-22 ENCOUNTER — Encounter (HOSPITAL_COMMUNITY): Payer: Self-pay

## 2021-07-22 NOTE — Plan of Care (Signed)
?  Problem: Depression CCP Problem  1  ?Goal: LTG: Darlean WILL SCORE LESS THAN 10 ON THE PATIENT HEALTH QUESTIONNAIRE (PHQ-9) ?Outcome: Not Applicable ?Goal: STG: Gissel WILL IDENTIFY 3 COGNITIVE PATTERNS AND BELIEFS THAT SUPPORT DEPRESSION ?Outcome: Not Applicable ?  ?Problem: Anxiety Disorder CCP Problem  1  ?Goal: LTG: Patient will score less than 5 on the Generalized Anxiety Disorder 7 Scale (GAD-7) ?Outcome: Not Applicable ?Goal: STG: Patient will practice problem solving skills 3 times per week for the next 4 weeks ?Outcome: Not Applicable ?Goal: STG: Patient will reduce frequency of avoidant behaviors by 50% as evidenced by self-report in therapy sessions ?Outcome: Not Applicable ?  ?

## 2021-08-04 ENCOUNTER — Encounter: Payer: Self-pay | Admitting: Nurse Practitioner

## 2021-08-07 ENCOUNTER — Other Ambulatory Visit: Payer: Self-pay | Admitting: Nurse Practitioner

## 2021-08-07 DIAGNOSIS — F419 Anxiety disorder, unspecified: Secondary | ICD-10-CM

## 2021-08-07 MED ORDER — BUPROPION HCL ER (XL) 150 MG PO TB24: 150.0000 mg | ORAL_TABLET | ORAL | 2 refills | Status: DC

## 2021-08-17 DIAGNOSIS — Z419 Encounter for procedure for purposes other than remedying health state, unspecified: Secondary | ICD-10-CM | POA: Diagnosis not present

## 2021-08-27 ENCOUNTER — Encounter (HOSPITAL_COMMUNITY): Payer: Self-pay | Admitting: Emergency Medicine

## 2021-08-27 ENCOUNTER — Emergency Department (HOSPITAL_COMMUNITY)
Admission: EM | Admit: 2021-08-27 | Discharge: 2021-08-28 | Disposition: A | Discharge status: Other Acute Inpatient Hospital | Payer: Medicaid Other | Attending: Emergency Medicine | Admitting: Emergency Medicine

## 2021-08-27 ENCOUNTER — Other Ambulatory Visit: Payer: Self-pay

## 2021-08-27 DIAGNOSIS — Z20822 Contact with and (suspected) exposure to covid-19: Secondary | ICD-10-CM | POA: Insufficient documentation

## 2021-08-27 DIAGNOSIS — R4589 Other symptoms and signs involving emotional state: Secondary | ICD-10-CM | POA: Diagnosis not present

## 2021-08-27 DIAGNOSIS — Z743 Need for continuous supervision: Secondary | ICD-10-CM | POA: Diagnosis not present

## 2021-08-27 DIAGNOSIS — F332 Major depressive disorder, recurrent severe without psychotic features: Secondary | ICD-10-CM | POA: Diagnosis not present

## 2021-08-27 DIAGNOSIS — T50904A Poisoning by unspecified drugs, medicaments and biological substances, undetermined, initial encounter: Secondary | ICD-10-CM | POA: Diagnosis not present

## 2021-08-27 DIAGNOSIS — T43212A Poisoning by selective serotonin and norepinephrine reuptake inhibitors, intentional self-harm, initial encounter: Secondary | ICD-10-CM | POA: Insufficient documentation

## 2021-08-27 DIAGNOSIS — T1491XA Suicide attempt, initial encounter: Secondary | ICD-10-CM

## 2021-08-27 LAB — ACETAMINOPHEN LEVEL: Acetaminophen (Tylenol), Serum: <10 ug/mL — ABNORMAL LOW (ref 10–30)

## 2021-08-27 LAB — SALICYLATE LEVEL: Salicylate Lvl: <7 mg/dL — ABNORMAL LOW (ref 7.0–30.0)

## 2021-08-27 LAB — CBC
HCT: 41.5 % (ref 36.0–46.0)
Hemoglobin: 12.7 g/dL (ref 12.0–15.0)
MCH: 27.1 pg (ref 26.0–34.0)
MCHC: 30.6 g/dL (ref 30.0–36.0)
MCV: 88.7 fL (ref 80.0–100.0)
Platelets: 403 10*3/uL — ABNORMAL HIGH (ref 150–400)
RBC: 4.68 MIL/uL (ref 3.87–5.11)
RDW: 14.6 % (ref 11.5–15.5)
WBC: 13.9 10*3/uL — ABNORMAL HIGH (ref 4.0–10.5)
nRBC: 0 % (ref 0.0–0.2)

## 2021-08-27 LAB — COMPREHENSIVE METABOLIC PANEL WITH GFR
ALT: 24 U/L (ref 0–44)
AST: 23 U/L (ref 15–41)
Albumin: 3.7 g/dL (ref 3.5–5.0)
Alkaline Phosphatase: 70 U/L (ref 38–126)
Anion gap: 8 (ref 5–15)
BUN: 12 mg/dL (ref 6–20)
CO2: 25 mmol/L (ref 22–32)
Calcium: 9.2 mg/dL (ref 8.9–10.3)
Chloride: 106 mmol/L (ref 98–111)
Creatinine, Ser: 0.82 mg/dL (ref 0.44–1.00)
GFR, Estimated: >60 mL/min
Glucose, Bld: 112 mg/dL — ABNORMAL HIGH (ref 70–99)
Potassium: 3.4 mmol/L — ABNORMAL LOW (ref 3.5–5.1)
Sodium: 139 mmol/L (ref 135–145)
Total Bilirubin: 0.4 mg/dL (ref 0.3–1.2)
Total Protein: 7.4 g/dL (ref 6.5–8.1)

## 2021-08-27 LAB — RAPID URINE DRUG SCREEN, HOSP PERFORMED
Amphetamines: NOT DETECTED
Barbiturates: NOT DETECTED
Benzodiazepines: NOT DETECTED
Cocaine: NOT DETECTED
Opiates: NOT DETECTED
Tetrahydrocannabinol: NOT DETECTED

## 2021-08-27 LAB — ETHANOL: Alcohol, Ethyl (B): <10 mg/dL

## 2021-08-27 LAB — PREGNANCY, URINE: Preg Test, Ur: NEGATIVE

## 2021-08-27 MED ORDER — LORAZEPAM 0.5 MG PO TABS: 0.5000 mg | ORAL_TABLET | Freq: Three times a day (TID) | ORAL | Status: DC | PRN

## 2021-08-27 NOTE — ED Notes (Signed)
Pt uncooperative w/ assessment. Pt told this RN that she took 6 lexapro and then said that she didn't overdose. This RN asked her why she told me she took 6 lexapro then and pt responded, because my sister told yall I did. Pt expressed that she doesn't want to be here and doesn't understand why we are holding her against her will and took her phone. Explained to pt the [facility] process and that the next step is a TTS assessment, which pt reports she will not talk to them. Offered pt a phone call w/ our desk phone and pt declined.

## 2021-08-27 NOTE — ED Notes (Signed)
Pt belongings found in triage and inventoried. 1 bag of belongings placed in SMALL locker 4 (dress, shoes, charging block and cord). Valuables (keys, Visa 6140, cell phone w/ screen intact and ID) given to security to be locked up. No meds brought to ED

## 2021-08-27 NOTE — Progress Notes (Signed)
Pt has been accepted to [facility] tomorrow 08/28/21; Susann Givens [address] [place]. Phone number for report: [phone].   Pt meets inpatient criteria per Ophelia Shoulder, NP.    Accepting provider: Dr. Clarene Duke, MD  Care Team notified: Denton Ar, RN, and Ophelia Shoulder, NP.   Maryjean Ka, MSW, [facility] 08/27/2021 4:46 PM

## 2021-08-27 NOTE — ED Provider Notes (Signed)
[facility] EMERGENCY DEPARTMENT Provider Note   CSN: [id number] Arrival date & time: 08/27/21  0324     History  Chief Complaint  Patient presents with   Drug Overdose   Suicide Attempt    Catherine Cochran is a 26 y.o. female.   Drug Overdose Pertinent negatives include no chest pain, no abdominal pain and no shortness of breath.  26 year old female presents to the emergency department after an intentional overdose.  She said that she took 6 of her 10 mg pills of Lexapro around 1 AM this morning because she "did not want to feel anxious anymore."  She noted intent to harm self but no intent to harm others.  She is currently endorsing no symptoms. Denies fever, chills, night sweats, chest pain, shortness of breath, abdominal pain, N/V/D, urinary/vaginal symptoms, change in bowel habits.  Past Medical History:  Diagnosis Date   Anemia    Hypertension    Gestational HTN 2018   Past Surgical History:  Procedure Laterality Date   CESAREAN SECTION  2017, 2018   CESAREAN SECTION N/A 08/25/2018   Procedure: CESAREAN SECTION;  Surgeon: Conan Bowens, MD;  Location: [facility] LD ORS;  Service: Obstetrics;  Laterality: N/A;   TONSILLECTOMY     2nd grade   WISDOM TOOTH EXTRACTION      Home Medications Prior to Admission medications   Medication Sig Start Date End Date Taking? Authorizing Provider  buPROPion (WELLBUTRIN XL) 150 MG 24 hr tablet Take 1 tablet (150 mg total) by mouth every morning. 08/07/21 08/07/22  Arnette Felts, FNP  hydrOXYzine (VISTARIL) 25 MG capsule Take 1 capsule (25 mg total) by mouth 3 (three) times daily as needed. 05/24/21   Arnette Felts, FNP  Vitamin D, Ergocalciferol, (DRISDOL) 1.25 MG (50000 UNIT) CAPS capsule Take 1 capsule (50,000 Units total) by mouth every 7 (seven) days. 05/02/21   Arnette Felts, FNP      Allergies    Patient has no known allergies.    Review of Systems   Review of Systems  Constitutional:  Negative for chills and fever.   HENT:  Negative for ear pain and sore throat.   Eyes:  Negative for pain and visual disturbance.  Respiratory:  Negative for cough and shortness of breath.   Cardiovascular:  Negative for chest pain and palpitations.  Gastrointestinal:  Negative for abdominal pain and vomiting.  Genitourinary:  Negative for dysuria and hematuria.  Musculoskeletal:  Negative for arthralgias and back pain.  Skin:  Negative for color change and rash.  Neurological:  Negative for seizures and syncope.  Psychiatric/Behavioral:  Positive for suicidal ideas.   All other systems reviewed and are negative.   Physical Exam Updated Vital Signs BP 136/88 (BP Location: Left Wrist)   Pulse 80   Temp 98.4 F (36.9 C) (Oral)   Resp 18   SpO2 99%  Physical Exam Vitals and nursing note reviewed.  Constitutional:      General: She is not in acute distress.    Appearance: She is well-developed. She is obese.  HENT:     Head: Normocephalic and atraumatic.  Eyes:     Conjunctiva/sclera: Conjunctivae normal.  Cardiovascular:     Rate and Rhythm: Normal rate and regular rhythm.     Heart sounds: No murmur heard. Pulmonary:     Effort: Pulmonary effort is normal. No respiratory distress.     Breath sounds: Normal breath sounds.  Abdominal:     Palpations: Abdomen is soft.  Tenderness: There is no abdominal tenderness.  Musculoskeletal:        General: No swelling.     Cervical back: Neck supple.  Skin:    General: Skin is warm and dry.     Capillary Refill: Capillary refill takes less than 2 seconds.  Neurological:     Mental Status: She is alert.  Psychiatric:        Mood and Affect: Mood normal.     ED Results / Procedures / Treatments   Labs (all labs ordered are listed, but only abnormal results are displayed) Labs Reviewed  COMPREHENSIVE METABOLIC PANEL - Abnormal; Notable for the following components:      Result Value   Potassium 3.4 (*)    Glucose, Bld 112 (*)    All other components  within normal limits  SALICYLATE LEVEL - Abnormal; Notable for the following components:   Salicylate Lvl <7.0 (*)    All other components within normal limits  ACETAMINOPHEN LEVEL - Abnormal; Notable for the following components:   Acetaminophen (Tylenol), Serum <10 (*)    All other components within normal limits  CBC - Abnormal; Notable for the following components:   WBC 13.9 (*)    Platelets 403 (*)    All other components within normal limits  ETHANOL  RAPID URINE DRUG SCREEN, HOSP PERFORMED  PREGNANCY, URINE    EKG None  Radiology No results found.  Procedures Procedures    Medications Ordered in ED Medications - No data to display  ED Course/ Medical Decision Making/ A&P                           Medical Decision Making Amount and/or Complexity of Data Reviewed Labs: ordered.   This patient presents to the ED for concern of suicidal ideation, this involves an extensive number of treatment options, and is a complaint that carries with it a high risk of complications and morbidity.  The differential diagnosis includes toxic effect ingestion, polysubstance ingestion, suicidal ideation, homicidal ideation   Co morbidities that complicate the patient evaluation  Obesity   Lab Tests:  I Ordered, and personally interpreted labs.  The pertinent results include: WBC of 13.9, negative on drug and alcohol studies obtained.   Imaging Studies ordered:  N/a   Cardiac Monitoring: / EKG:  The patient was maintained on a cardiac monitor.  I personally viewed and interpreted the cardiac monitored which showed an underlying rhythm of: Sinus rhythm   Consultations Obtained:  Poison control was consulted and they recommended EKG with repeat prior to medical clearance.  Monitor for 8 hours after ingestion.  Problem List / ED Course / Critical interventions / Medication management  Suicidal attempt Reevaluation of the patient showed that the patient stayed the  same I have reviewed the patients home medicines and have made adjustments as needed   Social Determinants of Health:  Denies tobacco, illicit drug use   Test / Admission - Considered:  Suicidal attempt Vitals signs within normal range and stable throughout visit. Laboratory studies negative for common co-ingestion substances.  IVC placed given patient's suicide attempt as well as repeated nursing notes expressing patient uncooperation.  Patient's current demeanor is agitated.   Patient deemed not safe for discharge.  TTS to be consulted.  Patient currently does not have any acute physical complaints and is in no acute distress.  Poison control recommendation to monitor for 8 hours from ingestion with repeat EKG concluded with  no acute abnormalities.  Patient is cleared medically for admission and further work-up.         Final Clinical Impression(s) / ED Diagnoses Final diagnoses:  Suicide attempt [facility](HCC)    Rx / DC Orders ED Discharge Orders     None         Peter GarterRobbins, Zarin Knupp A, [place] 08/27/21 1043    Gloris Manchesterixon, Ryan, MD 08/27/21 [phone]

## 2021-08-27 NOTE — ED Notes (Signed)
Pt changed into purple scrubs 

## 2021-08-27 NOTE — ED Notes (Signed)
Provider at bedside at this time

## 2021-08-27 NOTE — ED Triage Notes (Addendum)
Pt brought to ED by [facility] for suicidal attempt after reportedly taking six 10mg  pills of Lexapro at approximately 0100. Pt endorses attempting self harm and EMS reports pt has been uncooperative. EMS reports making case with poison control, recommendations are observation.

## 2021-08-27 NOTE — ED Notes (Signed)
Keys given to [place]ique Stinson per pt request.

## 2021-08-27 NOTE — ED Notes (Signed)
Pt now wanting Catherine Cochran to have the keys locked up w/ security d/t they are his keys and he needs them for work.

## 2021-08-27 NOTE — BH Assessment (Signed)
@  1218, requested patient's nurse Raquel Sarna, RN) to place the TTS machine in patient's room.

## 2021-08-27 NOTE — ED Notes (Addendum)
[place] Poison control called and verified information provided to EMS.   Poison control recommendations: EKG now and repeat prior to medical clearance Monitor pt for 8hrs post ingestion. Time of ingestion 0100.

## 2021-08-27 NOTE — ED Notes (Signed)
Per poison control, obtain repeat EKG and then they will be able to sign case out. Notified providers. Pt refusing EKG at this time.

## 2021-08-27 NOTE — ED Notes (Signed)
Pt reports that she is going to sue the hospital after her TTS consult.

## 2021-08-27 NOTE — ED Notes (Signed)
Called staffing for a sitter, no sitters available per staffing. Notified CN.

## 2021-08-27 NOTE — ED Notes (Signed)
Visitor in lobby requesting pt keys from belongings. Pt stated, "No". When asked if keys could be given to visitor. Keys remain in pt valuables w/ security.

## 2021-08-27 NOTE — Progress Notes (Signed)
Inpatient Behavioral Health Placement   Pt meets inpatient criteria per Ophelia Shoulder, NP. There are no available beds at [facility] per [facility] Rosey Bath, RN. Referral was sent to the following facilities;   Destination Service Provider Address Phone Fax  [facility]  [address] [place], [place] [address] [phone]  [facility]  [address] [place] [address] [phone]  [facility]  [address] [place] [address] [phone]  [facility]  [address] [place] [address] [phone]  [facility]  [address] [place] [address] [phone]  [facility]  [address] [place] [address] [phone]  [facility]  [address], [place] [address] [phone]  [facility]  [address] [place] [address] [phone]  [facility]  [address], [place] [address] [phone]  [facility]  [address] [place] [address] [phone]  [facility]  [address] [place], [place] [address] [phone]  [facility]  [address] [place] [address] [phone]    Situation ongoing,  CSW will follow up.   Maryjean Ka, MSW, LCSWA 08/27/2021  @ 3:32 PM
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
## 2021-08-27 NOTE — ED Notes (Signed)
Pt having 2nd phone call at this time 

## 2021-08-27 NOTE — ED Notes (Signed)
Pt tearful and refusing to eat meal tray that was brought to her

## 2021-08-27 NOTE — ED Notes (Signed)
Pt moved to Tra B briefly for TTS assessment

## 2021-08-27 NOTE — ED Notes (Signed)
Pt arrived to hallway 22 dressed in purple scrubs.

## 2021-08-27 NOTE — ED Notes (Signed)
Pt having first phone call at this time  

## 2021-08-28 DIAGNOSIS — F332 Major depressive disorder, recurrent severe without psychotic features: Secondary | ICD-10-CM | POA: Diagnosis not present

## 2021-08-28 DIAGNOSIS — R45851 Suicidal ideations: Secondary | ICD-10-CM | POA: Diagnosis not present

## 2021-08-28 LAB — RESP PANEL BY RT-PCR (FLU A&B, COVID) ARPGX2
Influenza A by PCR: NEGATIVE
Influenza B by PCR: NEGATIVE
SARS Coronavirus 2 by RT PCR: NEGATIVE

## 2021-08-28 NOTE — ED Notes (Signed)
I attempted to call Catherine Cochran to give report and had to leave a message.

## 2021-08-28 NOTE — ED Provider Notes (Signed)
Patient has been accepted by [facility], for inpatient psychiatric care for suicidal ideation.  Patient will be transferred to that facility today by [facility].  Patient is IVC.   Vanetta Mulders, MD 08/28/21 1019

## 2021-08-28 NOTE — ED Notes (Signed)
I attempted to give Catherine Cochran report and was told the nurse would call me back.

## 2021-08-29 ENCOUNTER — Telehealth: Payer: Self-pay

## 2021-08-29 DIAGNOSIS — R45851 Suicidal ideations: Secondary | ICD-10-CM | POA: Diagnosis not present

## 2021-08-29 DIAGNOSIS — F332 Major depressive disorder, recurrent severe without psychotic features: Secondary | ICD-10-CM | POA: Diagnosis not present

## 2021-08-29 NOTE — Telephone Encounter (Signed)
Transition Care Management Unsuccessful Follow-up Telephone Call  Date of discharge and from where:  08/28/2021-[place]   Attempts:  1st Attempt  Reason for unsuccessful TCM follow-up call:  Left voice message

## 2021-08-30 DIAGNOSIS — R45851 Suicidal ideations: Secondary | ICD-10-CM | POA: Diagnosis not present

## 2021-08-30 DIAGNOSIS — F332 Major depressive disorder, recurrent severe without psychotic features: Secondary | ICD-10-CM | POA: Diagnosis not present

## 2021-08-30 NOTE — Telephone Encounter (Signed)
Transition Care Management Unsuccessful Follow-up Telephone Call  Date of discharge and from where:  08/28/2021-[place]   Attempts:  2nd Attempt  Reason for unsuccessful TCM follow-up call:  Left voice message

## 2021-08-31 DIAGNOSIS — R45851 Suicidal ideations: Secondary | ICD-10-CM | POA: Diagnosis not present

## 2021-08-31 DIAGNOSIS — F332 Major depressive disorder, recurrent severe without psychotic features: Secondary | ICD-10-CM | POA: Diagnosis not present

## 2021-08-31 NOTE — Telephone Encounter (Signed)
Transition Care Management Unsuccessful Follow-up Telephone Call  Date of discharge and from where:  08/28/2021-[place]   Attempts:  3rd Attempt  Reason for unsuccessful TCM follow-up call:  Unable to reach patient    

## 2021-09-01 DIAGNOSIS — F332 Major depressive disorder, recurrent severe without psychotic features: Secondary | ICD-10-CM | POA: Diagnosis not present

## 2021-09-01 DIAGNOSIS — R45851 Suicidal ideations: Secondary | ICD-10-CM | POA: Diagnosis not present

## 2021-09-04 ENCOUNTER — Telehealth: Payer: Self-pay

## 2021-09-04 NOTE — Telephone Encounter (Signed)
Transition Care Management Unsuccessful Follow-up Telephone Call  Date of discharge and from where:  [place] 08/27/2021.  Attempts:  1st Attempt  Reason for unsuccessful TCM follow-up call:  Left voice message

## 2021-09-05 ENCOUNTER — Ambulatory Visit (HOSPITAL_COMMUNITY): Payer: Medicaid Other | Admitting: Physician Assistant

## 2021-09-05 ENCOUNTER — Telehealth: Payer: Self-pay

## 2021-09-05 NOTE — Telephone Encounter (Signed)
Error

## 2021-09-12 ENCOUNTER — Other Ambulatory Visit: Payer: Self-pay

## 2021-09-12 MED ORDER — ESCITALOPRAM OXALATE 10 MG PO TABS: 10.0000 mg | ORAL_TABLET | Freq: Every day | ORAL | 1 refills | Status: DC

## 2021-09-16 DIAGNOSIS — Z419 Encounter for procedure for purposes other than remedying health state, unspecified: Secondary | ICD-10-CM | POA: Diagnosis not present

## 2021-09-26 ENCOUNTER — Ambulatory Visit (INDEPENDENT_AMBULATORY_CARE_PROVIDER_SITE_OTHER): Payer: Medicaid Other | Admitting: Clinical

## 2021-09-26 ENCOUNTER — Telehealth (INDEPENDENT_AMBULATORY_CARE_PROVIDER_SITE_OTHER): Payer: Medicaid Other | Admitting: Psychiatry

## 2021-09-26 ENCOUNTER — Encounter (HOSPITAL_COMMUNITY): Payer: Self-pay | Admitting: Psychiatry

## 2021-09-26 DIAGNOSIS — F411 Generalized anxiety disorder: Secondary | ICD-10-CM

## 2021-09-26 DIAGNOSIS — F330 Major depressive disorder, recurrent, mild: Secondary | ICD-10-CM

## 2021-09-26 MED ORDER — FLUOXETINE HCL 20 MG PO CAPS: 20.0000 mg | ORAL_CAPSULE | Freq: Every day | ORAL | 3 refills | Status: DC

## 2021-09-26 NOTE — Progress Notes (Signed)
   THERAPIST PROGRESS NOTE  Session Time: 45 minutes  Participation Level: Active  Behavioral Response: CasualAlertDepressed  Type of Therapy: Individual Therapy  Treatment Goals addressed: client will practice behavioral activation skills 3 times per week for the next 12 weeks  ProgressTowards Goals: Progressing  Interventions: CBT and Supportive  Summary:  Catherine Cochran is a 26 y.o. female who presents for scheduled session oriented x5, appropriately dressed, and friendly.  Client denied hallucinations and delusions. Client reported since she was last seen she was taken to the hospital for suicide attempt. Client reported it was the first time being hospitalized for suicidal ideation. Client reported she intentionally overdosed on prescription medications following a argument with her daughter's father who lives in the home.  Client reported they coexist in the home.  Client reported they have periods of peace and then they argue. Client reported she feels he is immature but he has made comment that she speaks in a sarcastic tone. Client reported he says things that are hurtful in the heat of the moment and its hard for her to walk away. Client reported she reacts when she is triggered and does not process how to best handle situations. Client reported she has unresolved emotional issues that stem from childhood. Client reported after her mother passed she felt alone and had no one to connect with. Client reported she also has problems with anxiety in public settings, feeling like people are staring at her. Client reported she has goals that she wants to work on and improve herself but has a difficult time with the mental aspect of talking herself through it.  Evidence of progress towards goal:  client reported 1 negative belief interfering with therapy. Client reported 2 goals which are improving her self esteem and improving social anxiety.   Suicidal/Homicidal: Nowithout  intent/plan  Therapist Response:  Therapist began the appointment asking the client how she has been doing since last seen. Therapist used CBT to engage using active listening and positive emotional support. Therapist used CBT to ask client open-ended questions about her recent hospitalization regarding her mental health symptoms. Therapist used CBT to ask the client about changes that can be made in her living environment to help reduce stressors. Therapist used CBT to engage and discuss safety planning. Therapist used CBT to engage and discuss coping skills. Therapist used CBT ask the client to identify her progress with frequency of use with coping skills with continued practice in her daily activity.    Therapist assigned the client homework to work on self care.   Plan: Return again in 5 weeks.  Diagnosis: major depressive disorder, recurrent episode, mild with anxious distress  Collaboration of Care: Other therapist gave the client information to [place] mobile crisis phone number.  Patient/Guardian was advised Release of Information must be obtained prior to any record release in order to collaborate their care with an outside provider. Patient/Guardian was advised if they have not already done so to contact the registration department to sign all necessary forms in order for [place] to release information regarding their care.   Consent: Patient/Guardian gives verbal consent for treatment and assignment of benefits for services provided during this visit. Patient/Guardian expressed understanding and agreed to proceed.   Neena Rhymes Donelle Baba, LCSW 09/26/2021
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
## 2021-10-02 NOTE — Plan of Care (Signed)
  Problem: Depression CCP Problem  1  Goal: STG: Catherine Cochran WILL IDENTIFY 3 COGNITIVE PATTERNS AND BELIEFS THAT SUPPORT DEPRESSION Outcome: Progressing Goal: STG: Catherine Cochran WILL PRACTICE BEHAVIORAL ACTIVATION SKILLS 3 TIMES PER WEEK FOR THE NEXT 12 WEEKS Outcome: Progressing   Problem: Depression CCP Problem  1  Goal: LTG: Catherine Cochran WILL SCORE LESS THAN 10 ON THE PATIENT HEALTH QUESTIONNAIRE (PHQ-9) Outcome: Not Progressing   Problem: Anxiety Disorder CCP Problem  1  Goal: LTG: Patient will score less than 5 on the Generalized Anxiety Disorder 7 Scale (GAD-7) Outcome: Not Progressing Goal: STG: Patient will practice problem solving skills 3 times per week for the next 4 weeks Outcome: Not Progressing Goal: STG: Report a decrease in anxiety symptoms as evidenced by an overall reduction in anxiety score by a minimum of 25% on the Generalized Anxiety Disorder Scale Outcome: Not Progressing

## 2021-10-16 ENCOUNTER — Ambulatory Visit (INDEPENDENT_AMBULATORY_CARE_PROVIDER_SITE_OTHER): Payer: Medicaid Other | Admitting: Clinical

## 2021-10-16 DIAGNOSIS — F330 Major depressive disorder, recurrent, mild: Secondary | ICD-10-CM | POA: Diagnosis not present

## 2021-10-16 NOTE — Plan of Care (Signed)
  Problem: Depression CCP Problem  1  Goal: LTG: Alara WILL SCORE LESS THAN 10 ON THE PATIENT HEALTH QUESTIONNAIRE (PHQ-9) Outcome: Progressing Goal: STG: Ranee WILL IDENTIFY 3 COGNITIVE PATTERNS AND BELIEFS THAT SUPPORT DEPRESSION Outcome: Progressing Goal: STG: Jimmie WILL PRACTICE BEHAVIORAL ACTIVATION SKILLS 3 TIMES PER WEEK FOR THE NEXT 12 WEEKS Outcome: Progressing   Problem: Anxiety Disorder CCP Problem  1  Goal: LTG: Patient will score less than 5 on the Generalized Anxiety Disorder 7 Scale (GAD-7) Outcome: Progressing Goal: STG: Patient will practice problem solving skills 3 times per week for the next 4 weeks Outcome: Progressing Goal: STG: Report a decrease in anxiety symptoms as evidenced by an overall reduction in anxiety score by a minimum of 25% on the Generalized Anxiety Disorder Scale Outcome: Progressing   

## 2021-10-16 NOTE — Progress Notes (Signed)
THERAPIST PROGRESS NOTE   Session Time: 45 minutes  Participation Level: Active  Behavioral Response: CasualAlertEuthymic  Type of Therapy: Individual Therapy  Treatment Goals addressed: Client will practice behavioral activation skills 3 times per week for the next 12 weeks  ProgressTowards Goals: Progressing  Interventions: CBT and Supportive  Summary:  Catherine Cochran is a 26 y.o. female who presents for the scheduled session oriented x5, appropriately dressed, and friendly.  Client denied hallucinations and delusions. Client reported on today she is feeling better than she was at the last appointment.  Client reported nothing bad has happened since the last appointment.  Client reported something that she did not disclose previously is that she is in a relationship with her daughter's father whom she lives with.  Client reported the relationship continues to be a stressor because she sees his behaviors as "immature".  Client reported they tend to break her about little things and unnecessarily argue.  Client reported she feels like the relationship is one-sided and she feels unappreciated.  Client reported he shows little empathetic emotions and she often talks to him in a way of telling him what to do back a child because he does not take enough initiative to get things done.  Client reported he has made comment about how she talks to him but notes that since she was raised by her single mother and grandmother that she is just having to do things herself to get things done.  Client reported she notes that how she talks to him can be something that she improves and maybe that will change his behaviors.  Client reported positively speaking she is making more time to prioritize herself.  Client reported she is getting physical exercise at least 5 times a week.  Client reported she has also not giving things attention that she feels like she does not need to spend her energy on to cause  unnecessary stress. Evidence of progress towards goal: Client reported 5 out of 7 days she is getting physical exercise which helps to improve her mood.  Client reported 1 skill that she needs to work on is her style of communication.   Suicidal/Homicidal: Nowithout intent/plan  Therapist Response:  Therapist began the appointment asking the client how she has been doing since last seen. Therapist used CBT to engage using active listening and positive emotional support. Therapist used CBT to engage the client to ask her to describe interpersonal stressors that usually provoke negative emotions out of her. Therapist used CBT to engage client to discuss positive and respectful communication styles and boundaries. Therapist used CBT to ask her to identify positive changes that she is implementing to help improve her emotional wellbeing. Therapist used CBT ask the client to identify her progress with frequency of use with coping skills with continued practice in her daily activity.    Therapist assigned the client homework to practice reframing her words to improve communication in her relationship. Client was scheduled for next appointment.   Plan: Return again in 5 weeks.  Diagnosis: major depressive disorder, mild, with anxious distress  Collaboration of Care: Patient refused AEB none  Patient/Guardian was advised Release of Information must be obtained prior to any record release in order to collaborate their care with an outside provider. Patient/Guardian was advised if they have not already done so to contact the registration department to sign all necessary forms in order for [place] to release information regarding their care.   Consent: Patient/Guardian gives verbal consent for  treatment and assignment of benefits for services provided during this visit. Patient/Guardian expressed understanding and agreed to proceed.   Neena Rhymes Lynx Goodrich, LCSW 10/16/2021

## 2021-10-17 DIAGNOSIS — Z419 Encounter for procedure for purposes other than remedying health state, unspecified: Secondary | ICD-10-CM | POA: Diagnosis not present

## 2021-10-26 ENCOUNTER — Telehealth (HOSPITAL_COMMUNITY): Payer: Self-pay | Admitting: *Deleted

## 2021-10-26 NOTE — Telephone Encounter (Signed)
Submitted pa for patients fluoxetine and it went thru this am; Pharmacy notified.

## 2021-11-01 ENCOUNTER — Other Ambulatory Visit: Payer: Self-pay

## 2021-11-01 DIAGNOSIS — E559 Vitamin D deficiency, unspecified: Secondary | ICD-10-CM

## 2021-11-01 MED ORDER — VITAMIN D (ERGOCALCIFEROL) 1.25 MG (50000 UNIT) PO CAPS: 50000.0000 [IU] | ORAL_CAPSULE | ORAL | 1 refills | Status: DC

## 2021-11-17 DIAGNOSIS — Z419 Encounter for procedure for purposes other than remedying health state, unspecified: Secondary | ICD-10-CM | POA: Diagnosis not present

## 2021-11-26 NOTE — Progress Notes (Unsigned)
  I,Tianna Badgett,acting as a Neurosurgeon for [employer], FNP.,have documented all relevant documentation on the behalf of Arnette Felts, FNP,as directed by  Arnette Felts, FNP while in the presence of Arnette Felts, FNP  Subjective:     Patient ID: Catherine Cochran , female    DOB: 12-19-1995 , 26 y.o.   MRN: [id number]   Chief Complaint  Patient presents with  . Anxiety    HPI  Patient presents today for anxiety and depression follow up.     Anxiety Presents for follow-up visit. Patient reports no chest pain, dizziness or insomnia. Nighttime awakenings: none.      Past Medical History:  Diagnosis Date  . Anemia   . Hypertension    Gestational HTN 2018     Family History  Problem Relation Age of Onset  . Diabetes Mother   . HIV Mother   . Asthma Mother   . Asthma Brother   . Breast cancer Maternal Grandmother      Current Outpatient Medications:  .  acetaminophen (TYLENOL) 500 MG tablet, Take 500 mg by mouth every 6 (six) hours as needed for moderate pain or headache., Disp: , Rfl:  .  FLUoxetine (PROZAC) 20 MG capsule, Take 1 capsule (20 mg total) by mouth daily., Disp: 30 capsule, Rfl: 3 .  hydrOXYzine (VISTARIL) 25 MG capsule, Take 1 capsule (25 mg total) by mouth 3 (three) times daily as needed. (Patient not taking: Reported on 08/27/2021), Disp: 30 capsule, Rfl: 0 .  loratadine (CLARITIN) 10 MG tablet, Take 10 mg by mouth daily as needed for allergies., Disp: , Rfl:  .  Vitamin D, Ergocalciferol, (DRISDOL) 1.25 MG (50000 UNIT) CAPS capsule, Take 1 capsule (50,000 Units total) by mouth every Sunday., Disp: 24 capsule, Rfl: 1   No Known Allergies   Review of Systems  Constitutional: Negative.   Respiratory: Negative.    Cardiovascular: Negative.  Negative for chest pain.  Gastrointestinal: Negative.   Neurological: Negative.  Negative for dizziness.  Psychiatric/Behavioral:  The patient does not have insomnia.      There were no vitals filed for this visit. There is  no height or weight on file to calculate BMI.   Objective:  Physical Exam      Assessment And Plan:     There are no diagnoses linked to this encounter.    Patient was given opportunity to ask questions. Patient verbalized understanding of the plan and was able to repeat key elements of the plan. All questions were answered to their satisfaction.  Delana Meyer   I, Delana Meyer, have reviewed all documentation for this visit. The documentation on 11/26/21 for the exam, diagnosis, procedures, and orders are all accurate and complete.   IF YOU HAVE BEEN REFERRED TO A SPECIALIST, IT MAY TAKE 1-2 WEEKS TO SCHEDULE/PROCESS THE REFERRAL. IF YOU HAVE NOT HEARD FROM US/SPECIALIST IN TWO WEEKS, PLEASE GIVE [place] A CALL AT [phone] X [phone].   THE PATIENT IS ENCOURAGED TO PRACTICE SOCIAL DISTANCING DUE TO THE COVID-19 PANDEMIC.

## 2021-11-27 ENCOUNTER — Encounter: Payer: Self-pay | Admitting: Nurse Practitioner

## 2021-11-27 ENCOUNTER — Other Ambulatory Visit (HOSPITAL_COMMUNITY)
Admission: RE | Admit: 2021-11-27 | Discharge: 2021-11-27 | Disposition: A | Discharge status: Home / Self Care | Payer: Medicaid Other | Source: Ambulatory Visit | Attending: Nurse Practitioner | Admitting: Nurse Practitioner

## 2021-11-27 ENCOUNTER — Ambulatory Visit (INDEPENDENT_AMBULATORY_CARE_PROVIDER_SITE_OTHER): Payer: Medicaid Other | Admitting: Nurse Practitioner

## 2021-11-27 VITALS — BP 128/70 | HR 68 | Temp 98.3°F | Ht 65.0 in | Wt 336.8 lb

## 2021-11-27 DIAGNOSIS — F320 Major depressive disorder, single episode, mild: Secondary | ICD-10-CM

## 2021-11-27 DIAGNOSIS — D72828 Other elevated white blood cell count: Secondary | ICD-10-CM | POA: Diagnosis not present

## 2021-11-27 DIAGNOSIS — E559 Vitamin D deficiency, unspecified: Secondary | ICD-10-CM

## 2021-11-27 DIAGNOSIS — Z6841 Body Mass Index (BMI) 40.0 and over, adult: Secondary | ICD-10-CM | POA: Diagnosis not present

## 2021-11-27 DIAGNOSIS — Z124 Encounter for screening for malignant neoplasm of cervix: Secondary | ICD-10-CM

## 2021-11-27 DIAGNOSIS — F419 Anxiety disorder, unspecified: Secondary | ICD-10-CM

## 2021-11-27 DIAGNOSIS — R7309 Other abnormal glucose: Secondary | ICD-10-CM | POA: Diagnosis not present

## 2021-11-27 NOTE — Patient Instructions (Signed)

## 2021-11-28 LAB — CMP14+EGFR
ALT: 16 IU/L (ref 0–32)
AST: 19 IU/L (ref 0–40)
Albumin/Globulin Ratio: 1.6 (ref 1.2–2.2)
Albumin: 4.2 g/dL (ref 4.0–5.0)
Alkaline Phosphatase: 83 IU/L (ref 44–121)
BUN/Creatinine Ratio: 14 (ref 9–23)
BUN: 10 mg/dL (ref 6–20)
Bilirubin Total: <0.2 mg/dL (ref 0.0–1.2)
CO2: 23 mmol/L (ref 20–29)
Calcium: 9.6 mg/dL (ref 8.7–10.2)
Chloride: 103 mmol/L (ref 96–106)
Creatinine, Ser: 0.74 mg/dL (ref 0.57–1.00)
Globulin, Total: 2.7 g/dL (ref 1.5–4.5)
Glucose: 96 mg/dL (ref 70–99)
Potassium: 4.3 mmol/L (ref 3.5–5.2)
Sodium: 139 mmol/L (ref 134–144)
Total Protein: 6.9 g/dL (ref 6.0–8.5)
eGFR: 114 mL/min/{1.73_m2} (ref >59)

## 2021-11-28 LAB — CBC
Hematocrit: 43.5 % (ref 34.0–46.6)
Hemoglobin: 13.7 g/dL (ref 11.1–15.9)
MCH: 27.1 pg (ref 26.6–33.0)
MCHC: 31.5 g/dL (ref 31.5–35.7)
MCV: 86 fL (ref 79–97)
Platelets: 370 10*3/uL (ref 150–450)
RBC: 5.05 x10E6/uL (ref 3.77–5.28)
RDW: 14.8 % (ref 11.7–15.4)
WBC: 11.4 10*3/uL — ABNORMAL HIGH (ref 3.4–10.8)

## 2021-11-28 LAB — HEMOGLOBIN A1C
Est. average glucose Bld gHb Est-mCnc: 123 mg/dL
Hgb A1c MFr Bld: 5.9 % — ABNORMAL HIGH (ref 4.8–5.6)

## 2021-11-30 LAB — CYTOLOGY - PAP: Diagnosis: NEGATIVE

## 2021-12-07 ENCOUNTER — Ambulatory Visit (INDEPENDENT_AMBULATORY_CARE_PROVIDER_SITE_OTHER): Payer: Medicaid Other | Admitting: Clinical

## 2021-12-07 DIAGNOSIS — F330 Major depressive disorder, recurrent, mild: Secondary | ICD-10-CM | POA: Diagnosis not present
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
## 2021-12-07 NOTE — Plan of Care (Signed)
  Problem: Depression CCP Problem  1  Goal: LTG: Hazyl WILL SCORE LESS THAN 10 ON THE PATIENT HEALTH QUESTIONNAIRE (PHQ-9) Outcome: Progressing Goal: STG: Falan WILL IDENTIFY 3 COGNITIVE PATTERNS AND BELIEFS THAT SUPPORT DEPRESSION Outcome: Progressing Goal: STG: Delia WILL PRACTICE BEHAVIORAL ACTIVATION SKILLS 3 TIMES PER WEEK FOR THE NEXT 12 WEEKS Outcome: Progressing   Problem: Anxiety Disorder CCP Problem  1  Goal: LTG: Patient will score less than 5 on the Generalized Anxiety Disorder 7 Scale (GAD-7) Outcome: Progressing Goal: STG: Patient will practice problem solving skills 3 times per week for the next 4 weeks Outcome: Progressing Goal: STG: Report a decrease in anxiety symptoms as evidenced by an overall reduction in anxiety score by a minimum of 25% on the Generalized Anxiety Disorder Scale Outcome: Progressing

## 2021-12-17 DIAGNOSIS — Z419 Encounter for procedure for purposes other than remedying health state, unspecified: Secondary | ICD-10-CM | POA: Diagnosis not present

## 2021-12-21 ENCOUNTER — Other Ambulatory Visit: Payer: Self-pay | Admitting: Hematology and Oncology

## 2021-12-21 ENCOUNTER — Inpatient Hospital Stay: Payer: Medicaid Other | Admitting: Hematology and Oncology

## 2021-12-21 ENCOUNTER — Inpatient Hospital Stay: Payer: Medicaid Other | Attending: Hematology and Oncology

## 2021-12-21 DIAGNOSIS — D729 Disorder of white blood cells, unspecified: Secondary | ICD-10-CM

## 2021-12-27 ENCOUNTER — Telehealth (INDEPENDENT_AMBULATORY_CARE_PROVIDER_SITE_OTHER): Payer: Medicaid Other | Admitting: Psychiatry

## 2021-12-27 ENCOUNTER — Encounter (HOSPITAL_COMMUNITY): Payer: Self-pay | Admitting: Psychiatry

## 2021-12-27 DIAGNOSIS — F330 Major depressive disorder, recurrent, mild: Secondary | ICD-10-CM

## 2021-12-27 DIAGNOSIS — F411 Generalized anxiety disorder: Secondary | ICD-10-CM | POA: Diagnosis not present

## 2021-12-27 MED ORDER — FLUOXETINE HCL 20 MG PO CAPS: 20.0000 mg | ORAL_CAPSULE | Freq: Every day | ORAL | 3 refills | Status: DC

## 2021-12-27 NOTE — Progress Notes (Signed)
[place] MD/PA/NP OP Progress Note Virtual Visit via Video Note  I connected with Catherine Cochran on 12/27/21 at  9:00 AM EDT by a video enabled telemedicine application and verified that I am speaking with the correct person using two identifier  Location: Patient: Home Provider: Clinic   I discussed the limitations of evaluation and management by telemedicine and the availability of in person appointments. The patient expressed understanding and agreed to proceed.  I provided 30 minutes of non-face-to-face time during this encounter.   12/27/2021 10:52 AM Catherine Cochran  MRN:  [id number]  Chief Complaint: "I have been doing well"  HPI: 26 year old female seen today for follow up psychiatric evaluation.  She has a psychiatric history of depression, anxiety, and SI/SA. Currently she is managed on Prozac 20 mg daily. She reports Prozac is effective in managing her psychiatric conditions.   Today she is well-groomed, pleasant, cooperative, engaged in conversation, and maintained eye contact.  She informed Probation officer that she has been doing well. She notes that her anxiety and depression are well managed and notes that her mood is stable. Today provider conducted a GAD 7 and patient scored a 1, at her last visit she scored a 10.  Provider also conducted PHQ-9 and patient scored a 0, at her last visit she scored a 9.  She endorses sleep and appetite.  Today she denies SI/HI/AVH, mania, paranoia.   Patient informed Probation officer that she has to purchase a new car as her car recently has mechanical issues.   No medication changes made today.  Patient will continue Prozac as prescribed.  She will follow-up with outpatient counseling for therapy.  No other concerns at this time. Visit Diagnosis:    ICD-10-CM   1. Major depressive disorder, recurrent episode, mild with anxious distress (HCC)  F33.0 FLUoxetine (PROZAC) 20 MG capsule    2. Generalized anxiety disorder  F41.1 FLUoxetine (PROZAC) 20 MG capsule       Past Psychiatric History: depression, anxiety, and SI/SA  Past Medical History:  Past Medical History:  Diagnosis Date   Anemia    Hypertension    Gestational HTN 2018    Past Surgical History:  Procedure Laterality Date   CESAREAN SECTION  2017, 2018   CESAREAN SECTION N/A 08/25/2018   Procedure: CESAREAN SECTION;  Surgeon: Sloan Leiter, MD;  Location: [facility];  Service: Obstetrics;  Laterality: N/A;   TONSILLECTOMY     2nd grade   WISDOM TOOTH EXTRACTION      Family Psychiatric History: Unknown  Family History:  Family History  Problem Relation Age of Onset   Diabetes Mother    HIV Mother    Asthma Mother    Asthma Brother    Breast cancer Maternal Grandmother     Social History:  Social History   Socioeconomic History   Marital status: Single    Spouse name: Not on file   Number of children: 2   Years of education: 12   Highest education level: High school graduate  Occupational History   Occupation: Surveyor, quantity: [employer]  Tobacco Use   Smoking status: Former    Types: Cigars   Smokeless tobacco: Never   Tobacco comments:    Smoked for 2-3 months and around December 2022.   Vaping Use   Vaping Use: Never used  Substance and Sexual Activity   Alcohol use: Not Currently   Drug use: Never   Sexual activity: Yes  Other Topics Concern   Not  on file  Social History Narrative   Not on file   Social Determinants of Health   Financial Resource Strain: Low Risk  (08/12/2018)   Overall Financial Resource Strain (CARDIA)    Difficulty of Paying Living Expenses: Not hard at all  Food Insecurity: No Food Insecurity (08/12/2018)   Hunger Vital Sign    Worried About Running Out of Food in the Last Year: Never true    Ran Out of Food in the Last Year: Never true  Transportation Needs: Unknown (08/12/2018)   PRAPARE - Administrator, Civil Service (Medical): No    Lack of Transportation (Non-Medical): Not on file  Physical Activity:  Not on file  Stress: No Stress Concern Present (08/12/2018)   [employer] of Occupational Health - Occupational Stress Questionnaire    Feeling of Stress : Only a little  Social Connections: Not on file    Allergies: No Known Allergies  Metabolic Disorder Labs: Lab Results  Component Value Date   HGBA1C 5.9 (H) 11/27/2021   No results found for: "PROLACTIN" Lab Results  Component Value Date   CHOL 180 05/02/2021   TRIG 97 05/02/2021   HDL 38 (L) 05/02/2021   CHOLHDL 4.7 (H) 05/02/2021   LDLCALC 124 (H) 05/02/2021   Lab Results  Component Value Date   TSH 2.000 05/02/2021    Therapeutic Level Labs: No results found for: "LITHIUM" No results found for: "VALPROATE" No results found for: "CBMZ"  Current Medications: Current Outpatient Medications  Medication Sig Dispense Refill   acetaminophen (TYLENOL) 500 MG tablet Take 500 mg by mouth every 6 (six) hours as needed for moderate pain or headache.     FLUoxetine (PROZAC) 20 MG capsule Take 1 capsule (20 mg total) by mouth daily. 30 capsule 3   loratadine (CLARITIN) 10 MG tablet Take 10 mg by mouth daily as needed for allergies. (Patient not taking: Reported on 11/27/2021)     Vitamin D, Ergocalciferol, (DRISDOL) 1.25 MG (50000 UNIT) CAPS capsule Take 1 capsule (50,000 Units total) by mouth every Sunday. 24 capsule 1   No current facility-administered medications for this visit.     Musculoskeletal: Strength & Muscle Tone: within normal limits and Telehealth visit Gait & Station: normal, Telehealth visit Patient leans: N/A  Psychiatric Specialty Exam: Review of Systems  unknown if currently breastfeeding.There is no height or weight on file to calculate BMI.  General Appearance: Well Groomed  Eye Contact:  Good  Speech:  Clear and Coherent and Normal Rate  Volume:  Normal  Mood:  Euthymic  Affect:  Appropriate and Congruent  Thought Process:  Coherent, Goal Directed, and Linear  Orientation:  Full (Time,  Place, and Person)  Thought Content: WDL and Logical   Suicidal Thoughts:  No  Homicidal Thoughts:  No  Memory:  Immediate;   Good Recent;   Good Remote;   Good  Judgement:  Good  Insight:  Good  Psychomotor Activity:  Normal  Concentration:  Concentration: Good and Attention Span: Good  Recall:  Good  Fund of Knowledge: Good  Language: Good  Akathisia:  No  Handed:  Right  AIMS (if indicated): not done  Assets:  Communication Skills Desire for Improvement Financial Resources/Insurance Housing Intimacy Physical Health Social Support  ADL's:  Intact  Cognition: WNL  Sleep:  Good   Screenings: GAD-7    Flowsheet Row Video Visit from 12/27/2021 in [facility] Video Visit from 09/26/2021 in [facility] Counselor  from 07/18/2021 in [facility] Video Visit from 08/18/2018 in [facility] Routine Prenatal from 05/29/2018 in Center for [facility]  Total GAD-7 Score 1 10 12  0 2      PHQ2-9    Flowsheet Row Video Visit from 12/27/2021 in [facility] Most recent reading at 12/27/2021  9:22 AM Office Visit from 11/27/2021 in [facility] Most recent reading at 11/27/2021  8:43 AM Video Visit from 09/26/2021 in [facility] Most recent reading at 09/26/2021  8:55 AM Counselor from 09/26/2021 in [facility] Most recent reading at 09/26/2021  8:30 AM Counselor from 07/18/2021 in [facility] Most recent reading at 07/18/2021  9:17 AM  PHQ-2 Total Score 0 0 4 2 2   PHQ-9 Total Score 0 -- 9 4 7       Flowsheet Row ED from 08/27/2021 in [facility] EMERGENCY DEPARTMENT Counselor from 07/18/2021 in [facility] ED from 11/22/2020 in [facility] EMERGENCY DEPARTMENT  C-SSRS RISK  CATEGORY High Risk No Risk No Risk        Assessment and Plan: Patient notes that she is doing well on her current medication regimen.  No medication changes made today.  Patient agreeable to continue medication as prescribed.  1. Major depressive disorder, recurrent episode, mild with anxious distress (HCC)  Continue- FLUoxetine (PROZAC) 20 MG capsule; Take 1 capsule (20 mg total) by mouth daily.  Dispense: 30 capsule; Refill: 3  2. Generalized anxiety disorder  Continue- FLUoxetine (PROZAC) 20 MG capsule; Take 1 capsule (20 mg total) by mouth daily.  Dispense: 30 capsule; Refill: 3   Collaboration of Care: Collaboration of Care: Other provider involved in patient's care AEB counselor  Patient/Guardian was advised Release of Information must be obtained prior to any record release in order to collaborate their care with an outside provider. Patient/Guardian was advised if they have not already done so to contact the registration department to sign all necessary forms in order for [facility] to release information regarding their care.   Consent: Patient/Guardian gives verbal consent for treatment and assignment of benefits for services provided during this visit. Patient/Guardian expressed understanding and agreed to proceed.   Follow-up in 3 months Follow-up therapy   01/22/2021, NP 12/27/2021, 10:52 AM

## 2022-01-02 ENCOUNTER — Ambulatory Visit (HOSPITAL_COMMUNITY): Payer: Medicaid Other | Admitting: Clinical

## 2022-01-02 ENCOUNTER — Encounter (HOSPITAL_COMMUNITY): Payer: Self-pay

## 2022-01-02 ENCOUNTER — Telehealth (HOSPITAL_COMMUNITY): Payer: Self-pay | Admitting: Clinical

## 2022-01-02 NOTE — Telephone Encounter (Signed)
Therapist sent client a link via MyChart for the scheduled virtual appointment.  Client did not check in using the link.  Therapist attempted phone call to the client's number on file.  Client did not answer the phone.  Therapist left a voicemail instructing her if she would like to reschedule to give the office call back.

## 2022-01-17 DIAGNOSIS — Z419 Encounter for procedure for purposes other than remedying health state, unspecified: Secondary | ICD-10-CM | POA: Diagnosis not present

## 2022-01-25 ENCOUNTER — Other Ambulatory Visit (HOSPITAL_COMMUNITY): Payer: Self-pay | Admitting: Psychiatry

## 2022-01-25 DIAGNOSIS — F330 Major depressive disorder, recurrent, mild: Secondary | ICD-10-CM

## 2022-01-25 DIAGNOSIS — F411 Generalized anxiety disorder: Secondary | ICD-10-CM

## 2022-02-15 ENCOUNTER — Ambulatory Visit: Payer: Medicaid Other | Admitting: Nurse Practitioner

## 2022-02-16 ENCOUNTER — Encounter: Payer: Self-pay | Admitting: Nurse Practitioner

## 2022-02-16 DIAGNOSIS — Z419 Encounter for procedure for purposes other than remedying health state, unspecified: Secondary | ICD-10-CM | POA: Diagnosis not present

## 2022-02-20 ENCOUNTER — Ambulatory Visit (INDEPENDENT_AMBULATORY_CARE_PROVIDER_SITE_OTHER): Payer: Medicaid Other | Admitting: Nurse Practitioner

## 2022-02-20 ENCOUNTER — Encounter: Payer: Self-pay | Admitting: Nurse Practitioner

## 2022-02-20 VITALS — BP 118/84 | HR 107 | Ht 67.0 in | Wt 345.0 lb

## 2022-02-20 DIAGNOSIS — Z30011 Encounter for initial prescription of contraceptive pills: Secondary | ICD-10-CM | POA: Diagnosis not present

## 2022-02-20 DIAGNOSIS — Z30432 Encounter for removal of intrauterine contraceptive device: Secondary | ICD-10-CM | POA: Diagnosis not present

## 2022-02-20 MED ORDER — NORETHIN ACE-ETH ESTRAD-FE 1-20 MG-MCG PO TABS: 1.0000 | ORAL_TABLET | Freq: Every day | ORAL | 1 refills | Status: DC

## 2022-02-20 NOTE — Progress Notes (Signed)
   Acute Office Visit  Subjective:    Patient ID: Catherine Cochran, female    DOB: [birth year], 26 y.o.   MRN: [id number]   HPI 26 y.o. [id number] presents as new patient for IUD removal. Mirena IUD inserted 08/2018. Was amenorrheic for 3 years but had spotting that started 2 days ago with mild cramping. Wants to discuss other birth control options, preferably pill. Not necessarily unhappy with IUD but would rather have cycles each month. She had never been on birth control prior to IUD. Normal pap history, most recent 11/27/2021. H/O gestational hypertension. GAD, MDD managed by psychiatry.    Review of Systems  Constitutional: Negative.   Genitourinary: Negative.        Objective:    Physical Exam Constitutional:      Appearance: Normal appearance. She is obese.  Genitourinary:    General: Normal vulva.     Vagina: Normal.     Cervix: Normal.     Uterus: Normal.      Comments: IUD not visualized, unable to tease out with cytobrush. Tenaculum placed on anterior cervix, IUD clamp used to remove IUD. Patient tolerated well.     BP 118/84   Pulse (!) 107   Ht 5\' 7"  (1.702 m)   Wt (!) 345 lb (156.5 kg)   SpO2 98%   BMI 54.03 kg/m  Wt Readings from Last 3 Encounters:  02/20/22 (!) 345 lb (156.5 kg)  11/27/21 (!) 336 lb 12.8 oz (152.8 kg)  06/21/21 (!) 344 lb 14.4 oz (156.4 kg)        Patient informed chaperone available to be present for breast and/or pelvic exam. Patient has requested no chaperone to be present. Patient has been advised what will be completed during breast and pelvic exam.   Assessment & Plan:   Problem List Items Addressed This Visit   None Visit Diagnoses     Encounter for initial prescription of contraceptive pills    -  Primary   Relevant Medications   norethindrone-ethinyl estradiol-FE (LOESTRIN FE) 1-20 MG-MCG tablet   Encounter for IUD removal       Relevant Orders   IUD removal      Plan: IUD removed, patient tolerated well. Contraceptive  options were reviewed, including hormonal methods, both combination (pill, patch, vaginal ring) and progesterone-only (pill, Depo Provera and Nexplanon), intrauterine devices (Mirena, [place], [place], and [place]), Phexxi, barrier methods (condoms, diaphragm) and female/female sterilization. The mechanisms, risks, benefits and side effects of all methods were discussed. She is interested in COCs. Will start today. Backup contraception first 7 days. Will monitor BP twice daily first few weeks. Aware of slight risk for blood clots with use.      [place] DNP, 12:22 PM 02/20/2022

## 2022-03-19 DIAGNOSIS — Z419 Encounter for procedure for purposes other than remedying health state, unspecified: Secondary | ICD-10-CM | POA: Diagnosis not present

## 2022-03-28 ENCOUNTER — Ambulatory Visit: Payer: Medicaid Other | Admitting: Nurse Practitioner

## 2022-03-29 ENCOUNTER — Ambulatory Visit: Payer: Medicaid Other | Admitting: Nurse Practitioner

## 2022-04-03 ENCOUNTER — Telehealth (HOSPITAL_COMMUNITY): Payer: Self-pay | Admitting: Student in an Organized Health Care Education/Training Program

## 2022-04-03 ENCOUNTER — Encounter (HOSPITAL_COMMUNITY): Payer: Self-pay | Admitting: Student in an Organized Health Care Education/Training Program

## 2022-04-03 ENCOUNTER — Telehealth (INDEPENDENT_AMBULATORY_CARE_PROVIDER_SITE_OTHER): Payer: Medicaid Other | Admitting: Student in an Organized Health Care Education/Training Program

## 2022-04-03 DIAGNOSIS — F3340 Major depressive disorder, recurrent, in remission, unspecified: Secondary | ICD-10-CM | POA: Diagnosis not present

## 2022-04-03 DIAGNOSIS — F411 Generalized anxiety disorder: Secondary | ICD-10-CM

## 2022-04-03 DIAGNOSIS — F330 Major depressive disorder, recurrent, mild: Secondary | ICD-10-CM

## 2022-04-03 MED ORDER — FLUOXETINE HCL 20 MG PO CAPS: ORAL_CAPSULE | ORAL | 3 refills | Status: DC

## 2022-04-03 NOTE — Addendum Note (Signed)
Addended by: Damita Dunnings B on: 04/03/2022 01:16 PM   Modules accepted: Orders

## 2022-04-03 NOTE — Progress Notes (Signed)
[facility] MD/PA/NP OP Progress Note  04/03/2022 1:14 PM Catherine Cochran  MRN:  [id number]  Chief Complaint:  Chief Complaint  Patient presents with   Follow-up  Virtual Visit via Video Note  I connected with Jeanella Craze on 04/03/22 at  1:00 PM EST by a video enabled telemedicine application and verified that I am speaking with the correct person using two identifiers.  Location: Patient: Home Provider: Office   I discussed the limitations of evaluation and management by telemedicine and the availability of in person appointments. The patient expressed understanding and agreed to proceed.  History of Present Illness:  Catherine Cochran is a 27 year old female seen today for follow up psychiatric evaluation.  She has a psychiatric history of depression, anxiety, and SI/SA. Currently she is managed on Prozac 20 mg daily.   Patient reports that she has been compliant with her medication. Patient reports that hse has been doing well, and endorses "good" mod. Patient denies feeling constantly anxious. Patient denies feelings of frequent irritability. Patient denies SI, passive SI, HI, and AVH. Patient reports that her sleep has been good, endorsing 8h. Patient reports that her appetite has been "good." Patient reports that her concentration is good and denies anhedonia.   Patient reports that she works, and that overall it is going well. Patient denies any issues with her boss. Patient reports that her family also feel she is doing well. Patient denies restlessness and racing thoughts.    I discussed the assessment and treatment plan with the patient. The patient was provided an opportunity to ask questions and all were answered. The patient agreed with the plan and demonstrated an understanding of the instructions.   The patient was advised to call back or seek an in-person evaluation if the symptoms worsen or if the condition fails to improve as anticipated.  I provided 15 minutes of non-face-to-face time  during this encounter.   Freida Busman, MD  Visit Diagnosis:    ICD-10-CM   1. MDD (recurrent major depressive disorder) in remission ([place])  F33.40       Past Psychiatric History: depression, anxiety, and SI/SA   Past Medical History:  Past Medical History:  Diagnosis Date   Anemia    Hypertension    Gestational HTN 2018    Past Surgical History:  Procedure Laterality Date   CESAREAN SECTION  2017, 2018   CESAREAN SECTION N/A 08/25/2018   Procedure: CESAREAN SECTION;  Surgeon: Sloan Leiter, MD;  Location: [facility] LD ORS;  Service: Obstetrics;  Laterality: N/A;   TONSILLECTOMY     2nd grade   WISDOM TOOTH EXTRACTION      Family Psychiatric History: Unknown  Family History:  Family History  Problem Relation Age of Onset   Diabetes Mother    HIV Mother    Asthma Mother    Asthma Brother    Breast cancer Maternal Grandmother     Social History:  Social History   Socioeconomic History   Marital status: Single    Spouse name: Not on file   Number of children: 2   Years of education: 12   Highest education level: High school graduate  Occupational History   Occupation: Surveyor, quantity: [employer]  Tobacco Use   Smoking status: Former    Types: Cigars   Smokeless tobacco: Never   Tobacco comments:    Smoked for 2-3 months and around December 2022.   Vaping Use   Vaping Use: Never used  Substance and Sexual  Activity   Alcohol use: Not Currently   Drug use: Never   Sexual activity: Yes    Partners: Male    Birth control/protection: I.U.D., Condom    Comment: First IC @ 27y/o, Hx of CT+  Other Topics Concern   Not on file  Social History Narrative   Not on file   Social Determinants of Health   Financial Resource Strain: Low Risk  (08/12/2018)   Overall Financial Resource Strain (CARDIA)    Difficulty of Paying Living Expenses: Not hard at all  Food Insecurity: No Food Insecurity (08/12/2018)   Hunger Vital Sign    Worried About Running Out of Food in  the Last Year: Never true    [place] in the Last Year: Never true  Transportation Needs: Unknown (08/12/2018)   PRAPARE - Hydrologist (Medical): No    Lack of Transportation (Non-Medical): Not on file  Physical Activity: Not on file  Stress: No Stress Concern Present (08/12/2018)   [place]    Feeling of Stress : Only a little  Social Connections: Not on file    Allergies: No Known Allergies  Metabolic Disorder Labs: Lab Results  Component Value Date   HGBA1C 5.9 (H) 11/27/2021   No results found for: "PROLACTIN" Lab Results  Component Value Date   CHOL 180 05/02/2021   TRIG 97 05/02/2021   HDL 38 (L) 05/02/2021   CHOLHDL 4.7 (H) 05/02/2021   LDLCALC 124 (H) 05/02/2021   Lab Results  Component Value Date   TSH 2.000 05/02/2021    Therapeutic Level Labs: No results found for: "LITHIUM" No results found for: "VALPROATE" No results found for: "CBMZ"  Current Medications: Current Outpatient Medications  Medication Sig Dispense Refill   acetaminophen (TYLENOL) 500 MG tablet Take 500 mg by mouth every 6 (six) hours as needed for moderate pain or headache.     FLUoxetine (PROZAC) 20 MG capsule TAKE 1 CAPSULE(20 MG) BY MOUTH DAILY 30 capsule 3   loratadine (CLARITIN) 10 MG tablet Take 10 mg by mouth daily as needed for allergies.     norethindrone-ethinyl estradiol-FE (LOESTRIN FE) 1-20 MG-MCG tablet Take 1 tablet by mouth daily. 84 tablet 1   Vitamin D, Ergocalciferol, (DRISDOL) 1.25 MG (50000 UNIT) CAPS capsule Take 1 capsule (50,000 Units total) by mouth every Sunday. (Patient not taking: Reported on 02/20/2022) 24 capsule 1   No current facility-administered medications for this visit.     Musculoskeletal: defer  Psychiatric Specialty Exam: Review of Systems  Psychiatric/Behavioral:  Negative for decreased concentration, dysphoric mood, hallucinations, sleep  disturbance and suicidal ideas.     unknown if currently breastfeeding.There is no height or weight on file to calculate BMI.  General Appearance: Casual  Eye Contact:  Good  Speech:  Clear and Coherent  Volume:  Normal  Mood:  Euthymic  Affect:  Appropriate  Thought Process:  Coherent  Orientation:  Full (Time, Place, and Person)  Thought Content: Logical   Suicidal Thoughts:  No  Homicidal Thoughts:  No  Memory:  Immediate;   Good Recent;   Good  Judgement:  Good  Insight:  Good  Psychomotor Activity:  Normal  Concentration:  Concentration: Good  Recall:  NA  Fund of Knowledge: Good  Language: Good  Akathisia:  NA  Handed:    AIMS (if indicated): not done  Assets:  Communication Skills Desire for Improvement Housing Intimacy Resilience Social Support  ADL's:  Intact  Cognition: WNL  Sleep:  Good   Screenings: GAD-7    Flowsheet Row Video Visit from 12/27/2021 in [facility] Video Visit from 09/26/2021 in [facility] Counselor from 07/18/2021 in [facility] Video Visit from 08/18/2018 in [facility] for [facility] Routine Prenatal from 05/29/2018 in [facility]  Total GAD-7 Score 1 10 12  0 2      PHQ2-9    Flowsheet Row Video Visit from 12/27/2021 in [facility] Most recent reading at 12/27/2021  9:22 AM Office Visit from 11/27/2021 in [facility] Most recent reading at 11/27/2021  8:43 AM Video Visit from 09/26/2021 in [facility] Most recent reading at 09/26/2021  8:55 AM Counselor from 09/26/2021 in [facility] Most recent reading at 09/26/2021  8:30 AM Counselor from 07/18/2021 in [facility] Most recent reading at 07/18/2021  9:17 AM  PHQ-2 Total Score 0 0 4 2 2   PHQ-9 Total Score 0 -- 9 4 7       Flowsheet  Row ED from 08/27/2021 in [facility] EMERGENCY DEPARTMENT Counselor from 07/18/2021 in [facility] ED from 11/22/2020 in [facility] EMERGENCY DEPARTMENT  C-SSRS RISK CATEGORY High Risk No Risk No Risk        Assessment and Plan: Saida Lonon is a 27 year old female seen today for follow up psychiatric evaluation.  She has a psychiatric history of depression, anxiety, and SI/SA.  Patient endorses that she can use to be stable on her current medications and she has no concerns nor does her family.  Would not make any medication adjustments  MDD, remission Hx GAD  - Continue Prozac 20 mg  Follow-up in approximately 3 months  Collaboration of Care: Collaboration of Care:   Patient/Guardian was advised Release of Information must be obtained prior to any record release in order to collaborate their care with an outside provider. Patient/Guardian was advised if they have not already done so to contact the registration department to sign all necessary forms in order for [facility] to release information regarding their care.   Consent: Patient/Guardian gives verbal consent for treatment and assignment of benefits for services provided during this visit. Patient/Guardian expressed understanding and agreed to proceed.    PGY-3 [name], MD 04/03/2022, 1:14 PM

## 2022-04-16 ENCOUNTER — Encounter: Payer: Self-pay | Admitting: Nurse Practitioner

## 2022-04-19 DIAGNOSIS — Z419 Encounter for procedure for purposes other than remedying health state, unspecified: Secondary | ICD-10-CM | POA: Diagnosis not present

## 2022-05-03 ENCOUNTER — Encounter: Payer: Medicaid Other | Admitting: Nurse Practitioner

## 2022-05-18 DIAGNOSIS — Z419 Encounter for procedure for purposes other than remedying health state, unspecified: Secondary | ICD-10-CM | POA: Diagnosis not present

## 2022-06-18 DIAGNOSIS — Z419 Encounter for procedure for purposes other than remedying health state, unspecified: Secondary | ICD-10-CM | POA: Diagnosis not present

## 2022-07-02 ENCOUNTER — Encounter (HOSPITAL_COMMUNITY): Payer: Medicaid Other | Admitting: Student in an Organized Health Care Education/Training Program

## 2022-07-02 NOTE — Progress Notes (Deleted)
[facility] MD/PA/NP OP Progress Note  07/02/2022 8:53 AM Catherine Cochran  MRN:  [id number]  Chief Complaint: No chief complaint on file.  HPI: Catherine Cochran is a 27 year old female seen today for follow up psychiatric evaluation.  She has a psychiatric history of depression, anxiety, and SI/SA. Currently she is managed on Prozac 20 mg daily.  Visit Diagnosis: No diagnosis found.  Past Psychiatric History: depression, anxiety, and SI/SA     Past Medical History:  Past Medical History:  Diagnosis Date   Anemia    Hypertension    Gestational HTN 2018    Past Surgical History:  Procedure Laterality Date   CESAREAN SECTION  2017, 2018   CESAREAN SECTION N/A 08/25/2018   Procedure: CESAREAN SECTION;  Surgeon: Conan Bowens, MD;  Location: [facility];  Service: Obstetrics;  Laterality: N/A;   TONSILLECTOMY     2nd grade   WISDOM TOOTH EXTRACTION      Family Psychiatric History: Unknown   Family History:  Family History  Problem Relation Age of Onset   Diabetes Mother    HIV Mother    Asthma Mother    Asthma Brother    Breast cancer Maternal Grandmother     Social History:  Social History   Socioeconomic History   Marital status: Single    Spouse name: Not on file   Number of children: 2   Years of education: 12   Highest education level: High school graduate  Occupational History   Occupation: Lobbyist: [employer]  Tobacco Use   Smoking status: Former    Types: Cigars   Smokeless tobacco: Never   Tobacco comments:    Smoked for 2-3 months and around December 2022.   Vaping Use   Vaping Use: Never used  Substance and Sexual Activity   Alcohol use: Not Currently   Drug use: Never   Sexual activity: Yes    Partners: Male    Birth control/protection: I.U.D., Condom    Comment: First IC @ 27y/o, Hx of CT+  Other Topics Concern   Not on file  Social History Narrative   Not on file   Social Determinants of Health   Financial Resource Strain: Low Risk   (08/12/2018)   Overall Financial Resource Strain (CARDIA)    Difficulty of Paying Living Expenses: Not hard at all  Food Insecurity: No Food Insecurity (08/12/2018)   Hunger Vital Sign    Worried About Running Out of Food in the Last Year: Never true    Ran Out of Food in the Last Year: Never true  Transportation Needs: Unknown (08/12/2018)   PRAPARE - Administrator, Civil Service (Medical): No    Lack of Transportation (Non-Medical): Not on file  Physical Activity: Not on file  Stress: No Stress Concern Present (08/12/2018)   [employer] of Occupational Health - Occupational Stress Questionnaire    Feeling of Stress : Only a little  Social Connections: Not on file    Allergies: No Known Allergies  Metabolic Disorder Labs: Lab Results  Component Value Date   HGBA1C 5.9 (H) 11/27/2021   No results found for: "PROLACTIN" Lab Results  Component Value Date   CHOL 180 05/02/2021   TRIG 97 05/02/2021   HDL 38 (L) 05/02/2021   CHOLHDL 4.7 (H) 05/02/2021   LDLCALC 124 (H) 05/02/2021   Lab Results  Component Value Date   TSH 2.000 05/02/2021    Therapeutic Level Labs: No results found for: "LITHIUM"  No results found for: "VALPROATE" No results found for: "CBMZ"  Current Medications: Current Outpatient Medications  Medication Sig Dispense Refill   acetaminophen (TYLENOL) 500 MG tablet Take 500 mg by mouth every 6 (six) hours as needed for moderate pain or headache.     FLUoxetine (PROZAC) 20 MG capsule TAKE 1 CAPSULE(20 MG) BY MOUTH DAILY 30 capsule 3   loratadine (CLARITIN) 10 MG tablet Take 10 mg by mouth daily as needed for allergies.     norethindrone-ethinyl estradiol-FE (LOESTRIN FE) 1-20 MG-MCG tablet Take 1 tablet by mouth daily. 84 tablet 1   Vitamin D, Ergocalciferol, (DRISDOL) 1.25 MG (50000 UNIT) CAPS capsule Take 1 capsule (50,000 Units total) by mouth every Sunday. (Patient not taking: Reported on 02/20/2022) 24 capsule 1   No current  facility-administered medications for this visit.     Musculoskeletal: Strength & Muscle Tone: {desc; muscle tone:32375} Gait & Station: {PE GAIT ED XBJY:78295} Patient leans: {Patient Leans:21022755}  Psychiatric Specialty Exam: Review of Systems  unknown if currently breastfeeding.There is no height or weight on file to calculate BMI.  General Appearance: {Appearance:22683}  Eye Contact:  {BHH EYE CONTACT:22684}  Speech:  {Speech:22685}  Volume:  {Volume (PAA):22686}  Mood:  {BHH MOOD:22306}  Affect:  {Affect (PAA):22687}  Thought Process:  {Thought Process (PAA):22688}  Orientation:  {BHH ORIENTATION (PAA):22689}  Thought Content: {Thought Content:22690}   Suicidal Thoughts:  {ST/HT (PAA):22692}  Homicidal Thoughts:  {ST/HT (PAA):22692}  Memory:  {BHH MEMORY:22881}  Judgement:  {Judgement (PAA):22694}  Insight:  {Insight (PAA):22695}  Psychomotor Activity:  {Psychomotor (PAA):22696}  Concentration:  {Concentration:21399}  Recall:  {BHH GOOD/FAIR/POOR:22877}  Fund of Knowledge: {BHH GOOD/FAIR/POOR:22877}  Language: {BHH GOOD/FAIR/POOR:22877}  Akathisia:  {BHH YES OR NO:22294}  Handed:  {Handed:22697}  AIMS (if indicated): {Desc; done/not:10129}  Assets:  {Assets (PAA):22698}  ADL's:  {BHH AOZ'H:08657}  Cognition: {chl bhh cognition:304700322}  Sleep:  {BHH GOOD/FAIR/POOR:22877}   Screenings: GAD-7    Flowsheet Row Video Visit from 12/27/2021 in [facility] Video Visit from 09/26/2021 in [facility] Counselor from 07/18/2021 in [facility] Video Visit from 08/18/2018 [facility] Routine Prenatal from 05/29/2018 in [facility]  Total GAD-7 Score 0 2      PHQ2-9    Flowsheet Row Video Visit from 12/27/2021 in [facility] Most recent reading at 12/27/2021  9:22 AM Office Visit from 11/27/2021  in [facility] Most recent reading at 11/27/2021  8:43 AM Video Visit from 09/26/2021 in [facility] Most recent reading at 09/26/2021  8:55 AM Counselor from 09/26/2021 in [facility] Most recent reading at 09/26/2021  8:30 AM Counselor from 07/18/2021 in [facility] Most recent reading at 07/18/2021  9:17 AM  PHQ-2 Total Score 0 0 PHQ-9 Total Score 0 -- Flowsheet Row ED from 08/27/2021 in [facility] Emergency Department at [facility] Counselor from 07/18/2021 in [facility] ED from 11/22/2020 in [facility] Emergency Department at [facility]  C-SSRS RISK CATEGORY High Risk No Risk No Risk        Assessment and Plan: ***  Collaboration of Care: Collaboration of Care: [facility] OP Collaboration of QION:[id number]}  Patient/Guardian was advised Release of Information must be obtained prior to any record release in order  to collaborate their care with an outside provider. Patient/Guardian was advised if they have not already done so to contact the registration department to sign all necessary forms in order for [place] to release information regarding their care.   Consent: Patient/Guardian gives verbal consent for treatment and assignment of benefits for services provided during this visit. Patient/Guardian expressed understanding and agreed to proceed.    [name], MD 07/02/2022, 8:53 AM

## 2022-07-18 DIAGNOSIS — Z419 Encounter for procedure for purposes other than remedying health state, unspecified: Secondary | ICD-10-CM | POA: Diagnosis not present

## 2022-07-27 ENCOUNTER — Encounter (HOSPITAL_COMMUNITY): Payer: Self-pay | Admitting: Student in an Organized Health Care Education/Training Program

## 2022-07-27 ENCOUNTER — Telehealth (INDEPENDENT_AMBULATORY_CARE_PROVIDER_SITE_OTHER): Payer: Medicaid Other | Admitting: Student in an Organized Health Care Education/Training Program

## 2022-07-27 DIAGNOSIS — F330 Major depressive disorder, recurrent, mild: Secondary | ICD-10-CM

## 2022-07-27 DIAGNOSIS — F411 Generalized anxiety disorder: Secondary | ICD-10-CM

## 2022-07-27 MED ORDER — FLUOXETINE HCL 10 MG PO CAPS: 10.0000 mg | ORAL_CAPSULE | Freq: Every day | ORAL | 2 refills | Status: DC

## 2022-07-27 NOTE — Progress Notes (Signed)
Virtual Visit via Video Note  I connected with Catherine Cochran on 07/27/22 at  9:30 AM EDT by a video enabled telemedicine application and verified that I am speaking with the correct person using two identifiers.  Location: Patient: Home Provider: Office   I discussed the limitations of evaluation and management by telemedicine and the availability of in person appointments. The patient expressed understanding and agreed to proceed.    I discussed the assessment and treatment plan with the patient. The patient was provided an opportunity to ask questions and all were answered. The patient agreed with the plan and demonstrated an understanding of the instructions.   The patient was advised to call back or seek an in-person evaluation if the symptoms worsen or if the condition fails to improve as anticipated.  I provided 15 minutes of non-face-to-face time during this encounter.   [name], MD  [facility] MD/PA/NP OP Progress Note  07/27/2022 9:53 AM [name]  MRN:  [id number]  Chief Complaint:  Chief Complaint  Patient presents with   Follow-up   HPI: Catherine Cochran is a 27 year old female seen today for follow up psychiatric evaluation.  She has a psychiatric history of depression, anxiety, and SI/SA. Currently she is managed on Prozac 20 mg daily.   Patient reports that things have been going well for her she denies dysphoria and anhedonia. Patient reports good sleep and normal appetite.She reports that her focus and concentration are really good. 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 She reports not being overly stressed. She reports that she she does not have any overwhelming anxiety or on edge. Patient denies impulsivity and irritability. Patient denies SI, HI, and AVH.   Visit Diagnosis:    ICD-10-CM   1. 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 Generalized anxiety disorder  F41.1  FLUoxetine (PROZAC) 10 MG capsule      Past Psychiatric History: depression, anxiety, and SI/SA   Past Medical History:  Past Medical History:  Diagnosis Date   Anemia    Hypertension    Gestational HTN 2018    Past Surgical History:  Procedure Laterality Date   CESAREAN SECTION  2017, 2018   CESAREAN SECTION N/A 08/25/2018   Procedure: CESAREAN SECTION;  Surgeon: Conan Bowens, MD;  Location: [facility];  Service: Obstetrics;  Laterality: N/A;   TONSILLECTOMY     2nd grade   WISDOM TOOTH EXTRACTION      Family Psychiatric History: Unknown   Family History:  Family History  Problem Relation Age of Onset   Diabetes Mother    HIV Mother    Asthma Mother    Asthma Brother    Breast cancer Maternal Grandmother     Social History:  Social History   Socioeconomic History   Marital status: Single    Spouse name: Not on file   Number of children: 2   Years of education: 12   Highest education level: High school graduate  Occupational History   Occupation: Lobbyist: [employer]  Tobacco Use   Smoking status: Former    Types: Cigars   Smokeless tobacco: Never   Tobacco comments:    Smoked for 2-3 months and around December 2022.   Vaping Use   Vaping Use: Never used  Substance and Sexual Activity   Alcohol use: Not Currently   Drug use: Never   Sexual activity:  Yes    Partners: Male    Birth control/protection: I.U.D., Condom    Comment: First IC @ 27y/o, Hx of CT+  Other Topics Concern   Not on file  Social History Narrative   Not on file   Social Determinants of Health   Financial Resource Strain: Low Risk  (08/12/2018)   Overall Financial Resource Strain (CARDIA)    Difficulty of Paying Living Expenses: Not hard at all  Food Insecurity: No Food Insecurity (08/12/2018)   Hunger Vital Sign    Worried About Running Out of Food in the Last Year: Never true    Ran Out of Food in the Last Year: Never true  Transportation Needs: Unknown (08/12/2018)    PRAPARE - Administrator, Civil Service (Medical): No    Lack of Transportation (Non-Medical): Not on file  Physical Activity: Not on file  Stress: No Stress Concern Present (08/12/2018)   [employer] of Occupational Health - Occupational Stress Questionnaire    Feeling of Stress : Only a little  Social Connections: Not on file    Allergies: No Known Allergies  Metabolic Disorder Labs: Lab Results  Component Value Date   HGBA1C 5.9 (H) 11/27/2021   No results found for: "PROLACTIN" Lab Results  Component Value Date   CHOL 180 05/02/2021   TRIG 97 05/02/2021   HDL 38 (L) 05/02/2021   CHOLHDL 4.7 (H) 05/02/2021   LDLCALC 124 (H) 05/02/2021   Lab Results  Component Value Date   TSH 2.000 05/02/2021    Therapeutic Level Labs: No results found for: "LITHIUM" No results found for: "VALPROATE" No results found for: "CBMZ"  Current Medications: Current Outpatient Medications  Medication Sig Dispense Refill   FLUoxetine (PROZAC) 10 MG capsule Take 1 capsule (10 mg total) by mouth daily. 30 capsule 2   acetaminophen (TYLENOL) 500 MG tablet Take 500 mg by mouth every 6 (six) hours as needed for moderate pain or headache.     loratadine (CLARITIN) 10 MG tablet Take 10 mg by mouth daily as needed for allergies.     norethindrone-ethinyl estradiol-FE (LOESTRIN FE) 1-20 MG-MCG tablet Take 1 tablet by mouth daily. 84 tablet 1   Vitamin D, Ergocalciferol, (DRISDOL) 1.25 MG (50000 UNIT) CAPS capsule Take 1 capsule (50,000 Units total) by mouth every Sunday. (Patient not taking: Reported on 02/20/2022) 24 capsule 1   No current facility-administered medications for this visit.      Psychiatric Specialty Exam: Review of Systems  Psychiatric/Behavioral:  Negative for decreased concentration, dysphoric mood, hallucinations, sleep disturbance and suicidal ideas. The patient is not nervous/anxious.     unknown if currently breastfeeding.There is no height or weight on  file to calculate BMI.  General Appearance: Casual  Eye Contact:  Good  Speech:  Clear and Coherent  Volume:  Normal  Mood:  Euthymic  Affect:  Appropriate  Thought Process:  Coherent  Orientation:  Full (Time, Place, and Person)  Thought Content: Logical   Suicidal Thoughts:  No  Homicidal Thoughts:  No  Memory:  Immediate;   Good Recent;   Good  Judgement:  Good  Insight:  Good  Psychomotor Activity:  Normal  Concentration:  Concentration: Good  Recall:  NA  Fund of Knowledge: Good  Language: Good  Akathisia:  NA  Handed:    AIMS (if indicated): not done  Assets:  Communication Skills Desire for Improvement Housing Leisure Time Resilience Social Support  ADL's:  Intact  Cognition: WNL  Sleep:  Good  Screenings: GAD-7    Flowsheet Row Video Visit from 12/27/2021 in [facility] Video Visit from 09/26/2021 in [facility] Counselor from 07/18/2021 in [facility] Video Visit from 08/18/2018 in [facility] Routine Prenatal from 05/29/2018 in [facility]  Total GAD-7 Score 1 10 12  0 2      PHQ2-9    Flowsheet Row Video Visit from 12/27/2021 in [facility] Most recent reading at 12/27/2021  9:22 AM Office Visit from 11/27/2021 in [facility] Most recent reading at 11/27/2021  8:43 AM Video Visit from 09/26/2021 in [facility] Most recent reading at 09/26/2021  8:55 AM Counselor from 09/26/2021 in [facility] Most recent reading at 09/26/2021  8:30 AM Counselor from 07/18/2021 in [facility] Most recent reading at 07/18/2021  9:17 AM  PHQ-2 Total Score 0 0 4 2 2   PHQ-9 Total Score 0 -- 9 4 7       Flowsheet Row ED from 08/27/2021 in [facility] Emergency Department at [facility]  Counselor from 07/18/2021 in [facility] ED from 11/22/2020 in [facility] Emergency Department at [facility]  C-SSRS RISK CATEGORY High Risk No Risk No Risk        Assessment and Plan:   Patient reports that she is interested in coming down to 10mg  on Prozac. Patient has been on Prozac approx 10 months and doing well achieving remission. Will decrease dose, patient advised to call office if she feels symptoms remerging and new rx of 20mg  will be sent in.   MDD, remission Hx GAD   -  Decrease Prozac to 10 mg  F/u in July w/ Dr. Jerrel Ivory  Discussed with patient pending transition of care to a new resident starting July 1st, and likely discontinuation of care by this provider at that time.    Collaboration of Care: Collaboration of Care:   Patient/Guardian was advised Release of Information must be obtained prior to any record release in order to collaborate their care with an outside provider. Patient/Guardian was advised if they have not already done so to contact the registration department to sign all necessary forms in order for [place] to release information regarding their care.   Consent: Patient/Guardian gives verbal consent for treatment and assignment of benefits for services provided during this visit. Patient/Guardian expressed understanding and agreed to proceed.   PGY-3 [name], MD 07/27/2022, 9:53 AM

## 2022-08-18 DIAGNOSIS — Z419 Encounter for procedure for purposes other than remedying health state, unspecified: Secondary | ICD-10-CM | POA: Diagnosis not present

## 2022-08-21 ENCOUNTER — Telehealth: Payer: Medicaid Other

## 2022-09-17 DIAGNOSIS — Z419 Encounter for procedure for purposes other than remedying health state, unspecified: Secondary | ICD-10-CM | POA: Diagnosis not present

## 2022-09-19 ENCOUNTER — Ambulatory Visit (INDEPENDENT_AMBULATORY_CARE_PROVIDER_SITE_OTHER): Payer: Medicaid Other | Admitting: Student

## 2022-09-19 DIAGNOSIS — F411 Generalized anxiety disorder: Secondary | ICD-10-CM | POA: Diagnosis not present

## 2022-09-19 DIAGNOSIS — F3340 Major depressive disorder, recurrent, in remission, unspecified: Secondary | ICD-10-CM

## 2022-09-19 MED ORDER — FLUOXETINE HCL 10 MG PO CAPS: 10.0000 mg | ORAL_CAPSULE | Freq: Every day | ORAL | 2 refills | Status: DC

## 2022-09-19 NOTE — Progress Notes (Signed)
Virtual Visit via Video Note  I connected with Catherine Cochran on 09/19/22 at  1:00 PM EDT by a video enabled telemedicine application and verified that I am speaking with the correct person using two identifiers.  Location: Patient: Home Provider: Office   I discussed the limitations of evaluation and management by telemedicine and the availability of in person appointments. The patient expressed understanding and agreed to proceed.    I discussed the assessment and treatment plan with the patient. The patient was provided an opportunity to ask questions and all were answered. The patient agreed with the plan and demonstrated an understanding of the instructions.   The patient was advised to call back or seek an in-person evaluation if the symptoms worsen or if the condition fails to improve as anticipated.  I provided 15 minutes of non-face-to-face time during this encounter.   Carlyn Reichert, MD  [facility] MD/PA/NP OP Progress Note  09/19/2022 2:31 PM Catherine Cochran  MRN:  [id number]  Chief Complaint:  Chief Complaint  Patient presents with   Follow-up   HPI: Catherine Cochran is a 27 year old female seen today for follow up psychiatric evaluation.  She has a psychiatric history of depression, anxiety, and suicide attempt occurring in 2023 via overdose on 6 Lexapro tablets.  She has been experiencing mild anxiety symptoms and she is currently managed on Prozac 10 mg daily.  Her depressive symptoms are currently in remission.  Because this was my first time meeting the patient, pertinent social history is obtained in expedited fashion.  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  She states that she lives with her child and the father of her child.  Her daughter, Leavy Cella, is 67 years old and is functioning well.  She is not in preschool and her daughter's aunt takes care of her during most of the day.  The patient denies the use of alcohol or illegal drugs or nicotine  products.  The patient reports that her mood has been "good".  She reports that sleep and appetite are appropriate.  She denies anhedonia and denies experiencing any suicidal thoughts or feelings of hopelessness.  She states that anxiety is still somewhat problematic.  She states "I have trouble going out places, I do not go grocery shopping because I feel like people are judging me".  The patient is able to compensate by picking up the orders of groceries via her car.  The patient states that her anxiety comes up when she is making decisions but that she is able to think through solutions and make a good choice.  Visit Diagnosis:    ICD-10-CM   1. Generalized anxiety disorder  F41.1 FLUoxetine (PROZAC) 10 MG capsule    2. MDD (recurrent major depressive disorder) in remission [facility])  F33.40       Past Psychiatric History:  history of depression, anxiety, and suicide attempt occurring in 2023 via overdose on 6 Lexapro tablets  Past Medical History:  Past Medical History:  Diagnosis Date   Anemia    Hypertension    Gestational HTN 2018    Past Surgical History:  Procedure Laterality Date   CESAREAN SECTION  2017, 2018   CESAREAN SECTION N/A 08/25/2018   Procedure: CESAREAN SECTION;  Surgeon: Conan Bowens, MD;  Location: [facility] LD [facility]S;  Service: Obstetrics;  Laterality: N/A;   TONSILLECTOMY     2nd grade   WISDOM TOOTH EXTRACTION      Family Psychiatric History: Unknown  Family History:  Family History  Problem Relation Age of Onset   Diabetes Mother    HIV Mother    Asthma Mother    Asthma Brother    Breast cancer Maternal Grandmother     Social History:  Social History   Socioeconomic History   Marital status: Single    Spouse name: Not on file   Number of children: 2   Years of education: 12   Highest education level: High school graduate  Occupational History   Occupation: Lobbyist: [employer]  Tobacco Use   Smoking status: Former    Types: Cigars    Smokeless tobacco: Never   Tobacco comments:    Smoked for 2-3 months and around December 2022.   Vaping Use   Vaping Use: Never used  Substance and Sexual Activity   Alcohol use: Not Currently   Drug use: Never   Sexual activity: Yes    Partners: Male    Birth control/protection: I.U.D., Condom    Comment: First IC @ 27y/o, Hx of CT+  Other Topics Concern   Not on file  Social History Narrative   Not on file   Social Determinants of Health   Financial Resource Strain: Low Risk  (08/12/2018)   Overall Financial Resource Strain (CARDIA)    Difficulty of Paying Living Expenses: Not hard at all  Food Insecurity: No Food Insecurity (08/12/2018)   Hunger Vital Sign    Worried About Running Out of Food in the Last Year: Never true    Ran Out of Food in the Last Year: Never true  Transportation Needs: Unknown (08/12/2018)   PRAPARE - Administrator, Civil Service (Medical): No    Lack of Transportation (Non-Medical): Not on file  Physical Activity: Not on file  Stress: No Stress Concern Present (08/12/2018)   [employer] of Occupational Health - Occupational Stress Questionnaire    Feeling of Stress : Only a little  Social Connections: Not on file    Allergies: No Known Allergies  Metabolic Disorder Labs: Lab Results  Component Value Date   HGBA1C 5.9 (H) 11/27/2021   No results found for: "PROLACTIN" Lab Results  Component Value Date   CHOL 180 05/02/2021   TRIG 97 05/02/2021   HDL 38 (L) 05/02/2021   CHOLHDL 4.7 (H) 05/02/2021   LDLCALC 124 (H) 05/02/2021   Lab Results  Component Value Date   TSH 2.000 05/02/2021    Therapeutic Level Labs: No results found for: "LITHIUM" No results found for: "VALPROATE" No results found for: "CBMZ"  Current Medications: Current Outpatient Medications  Medication Sig Dispense Refill   acetaminophen (TYLENOL) 500 MG tablet Take 500 mg by mouth every 6 (six) hours as needed for moderate pain or headache.      FLUoxetine (PROZAC) 10 MG capsule Take 1 capsule (10 mg total) by mouth daily. 30 capsule 2   loratadine (CLARITIN) 10 MG tablet Take 10 mg by mouth daily as needed for allergies.     norethindrone-ethinyl estradiol-FE (LOESTRIN FE) 1-20 MG-MCG tablet Take 1 tablet by mouth daily. 84 tablet 1   Vitamin D, Ergocalciferol, (DRISDOL) 1.25 MG (50000 UNIT) CAPS capsule Take 1 capsule (50,000 Units total) by mouth every Sunday. (Patient not taking: Reported on 02/20/2022) 24 capsule 1   No current facility-administered medications for this visit.      Psychiatric Specialty Exam: Review of Systems  Psychiatric/Behavioral:  Negative for decreased concentration, dysphoric mood, hallucinations, sleep disturbance and suicidal ideas.  The patient is not nervous/anxious.     unknown if currently breastfeeding.There is no height or weight on file to calculate BMI.  General Appearance: Casual  Eye Contact:  Good  Speech:  Clear and Coherent  Volume:  Normal  Mood:  Euthymic  Affect:  Appropriate  Thought Process:  Coherent  Orientation:  Full (Time, Place, and Person)  Thought Content: Logical   Suicidal Thoughts:  No  Homicidal Thoughts:  No  Memory:  Immediate;   Good Recent;   Good  Judgement:  Good  Insight:  Good  Psychomotor Activity:  Normal  Concentration:  Concentration: Good  Recall:  NA  Fund of Knowledge: Good  Language: Good  Akathisia:  NA  Handed:    AIMS (if indicated): not done  Assets:  Communication Skills Desire for Improvement Housing Leisure Time Resilience Social Support  ADL's:  Intact  Cognition: WNL  Sleep:  Good   Screenings: GAD-7    Flowsheet Row Video Visit from 12/27/2021 in [facility] Video Visit from 09/26/2021 in [facility] Counselor from 07/18/2021 in [facility] Video Visit from 08/18/2018 in [facility] Routine Prenatal from 05/29/2018  in [facility]  Total GAD-7 Score 1 10 12  0 2      PHQ2-9    Flowsheet Row Video Visit from 12/27/2021 in [facility] Most recent reading at 12/27/2021  9:22 AM Office Visit from 11/27/2021 in [facility] Most recent reading at 11/27/2021  8:43 AM Video Visit from 09/26/2021 in [facility] Most recent reading at 09/26/2021  8:55 AM Counselor from 09/26/2021 in [facility] Most recent reading at 09/26/2021  8:30 AM Counselor from 07/18/2021 in [facility] Most recent reading at 07/18/2021  9:17 AM  PHQ-2 Total Score 0 0 4 2 2   PHQ-9 Total Score 0 -- 9 4 7       Flowsheet Row ED from 08/27/2021 in [facility] Emergency Department at [facility] Counselor from 07/18/2021 in [facility] ED from 11/22/2020 in [facility] Department at [facility]  C-SSRS RISK CATEGORY High Risk No Risk No Risk        Assessment and Plan:   Patient desires to continue her present dose of Prozac and work on her residual anxiety symptoms in therapy with Idalia Needle.  Therapy appointment was scheduled for her as well as follow-up with me.  MDD, remission Hx GAD - Continue Prozac at previous dose of 10 mg daily  Collaboration of Care: Collaboration of Care:   Patient/Guardian was advised Release of Information must be obtained prior to any record release in order to collaborate their care with an outside provider. Patient/Guardian was advised if they have not already done so to contact the registration department to sign all necessary forms in order for [place] to release information regarding their care.   Consent: Patient/Guardian gives verbal consent for treatment and assignment of benefits for services provided during this visit. Patient/Guardian expressed understanding and agreed to proceed.     Carlyn Reichert, MD PGY-3

## 2022-09-20 NOTE — Addendum Note (Signed)
Addended by: Theodoro Kos A on: 09/20/2022 01:21 PM   Modules accepted: Level of Service

## 2022-10-18 DIAGNOSIS — Z419 Encounter for procedure for purposes other than remedying health state, unspecified: Secondary | ICD-10-CM | POA: Diagnosis not present

## 2022-10-30 ENCOUNTER — Encounter (HOSPITAL_COMMUNITY): Payer: Self-pay

## 2022-10-31 ENCOUNTER — Telehealth (HOSPITAL_COMMUNITY): Payer: Medicaid Other | Admitting: Student

## 2022-11-02 ENCOUNTER — Encounter (HOSPITAL_COMMUNITY): Payer: Self-pay

## 2022-11-02 ENCOUNTER — Ambulatory Visit (HOSPITAL_COMMUNITY): Payer: Medicaid Other | Admitting: Clinical

## 2022-11-18 DIAGNOSIS — Z419 Encounter for procedure for purposes other than remedying health state, unspecified: Secondary | ICD-10-CM | POA: Diagnosis not present

## 2022-12-17 NOTE — Progress Notes (Signed)
No show

## 2022-12-18 DIAGNOSIS — Z419 Encounter for procedure for purposes other than remedying health state, unspecified: Secondary | ICD-10-CM | POA: Diagnosis not present

## 2023-01-18 DIAGNOSIS — Z419 Encounter for procedure for purposes other than remedying health state, unspecified: Secondary | ICD-10-CM | POA: Diagnosis not present

## 2023-02-07 ENCOUNTER — Ambulatory Visit: Payer: Medicaid Other | Admitting: Nurse Practitioner

## 2023-02-07 NOTE — Progress Notes (Deleted)
Madelaine Bhat, CMA,acting as a Neurosurgeon for Arnette Felts, FNP.,have documented all relevant documentation on the behalf of Arnette Felts, FNP,as directed by  Arnette Felts, FNP while in the presence of Arnette Felts, FNP.  Subjective:  Patient ID: Catherine Cochran , female    DOB: 14-Jan-1996 , 27 y.o.   MRN: [id number]  No chief complaint on file.   HPI  Patient presents today for a weight follow up, Patient reports compliance with medication. Patient denies any chest pain, SOB, or headaches. Patient has no concerns today.     Past Medical History:  Diagnosis Date  . Anemia   . Hypertension    Gestational HTN 2018     Family History  Problem Relation Age of Onset  . Diabetes Mother   . HIV Mother   . Asthma Mother   . Asthma Brother   . Breast cancer Maternal Grandmother      Current Outpatient Medications:  .  acetaminophen (TYLENOL) 500 MG tablet, Take 500 mg by mouth every 6 (six) hours as needed for moderate pain or headache., Disp: , Rfl:  .  FLUoxetine (PROZAC) 10 MG capsule, Take 1 capsule (10 mg total) by mouth daily., Disp: 30 capsule, Rfl: 2 .  loratadine (CLARITIN) 10 MG tablet, Take 10 mg by mouth daily as needed for allergies., Disp: , Rfl:  .  norethindrone-ethinyl estradiol-FE (LOESTRIN FE) 1-20 MG-MCG tablet, Take 1 tablet by mouth daily., Disp: 84 tablet, Rfl: 1 .  Vitamin D, Ergocalciferol, (DRISDOL) 1.25 MG (50000 UNIT) CAPS capsule, Take 1 capsule (50,000 Units total) by mouth every Sunday. (Patient not taking: Reported on 02/20/2022), Disp: 24 capsule, Rfl: 1   No Known Allergies   Review of Systems   There were no vitals filed for this visit. There is no height or weight on file to calculate BMI.  Wt Readings from Last 3 Encounters:  02/20/22 (!) 345 lb (156.5 kg)  11/27/21 (!) 336 lb 12.8 oz (152.8 kg)  06/21/21 (!) 344 lb 14.4 oz (156.4 kg)    The ASCVD Risk score (Arnett DK, et al., 2019) failed to calculate for the following reasons:   The 2019 ASCVD  risk score is only valid for ages 32 to 11  Objective:  Physical Exam      Assessment And Plan:  There are no diagnoses linked to this encounter.  No follow-ups on file.  Patient was given opportunity to ask questions. Patient verbalized understanding of the plan and was able to repeat key elements of the plan. All questions were answered to their satisfaction.    Jeanell Sparrow, FNP, have reviewed all documentation for this visit. The documentation on 02/07/23 for the exam, diagnosis, procedures, and orders are all accurate and complete.   IF YOU HAVE BEEN REFERRED TO A SPECIALIST, IT MAY TAKE 1-2 WEEKS TO SCHEDULE/PROCESS THE REFERRAL. IF YOU HAVE NOT HEARD FROM US/SPECIALIST IN TWO WEEKS, PLEASE GIVE [place] A CALL AT [phone].

## 2023-02-17 DIAGNOSIS — Z419 Encounter for procedure for purposes other than remedying health state, unspecified: Secondary | ICD-10-CM | POA: Diagnosis not present

## 2023-03-20 DIAGNOSIS — Z419 Encounter for procedure for purposes other than remedying health state, unspecified: Secondary | ICD-10-CM | POA: Diagnosis not present

## 2023-03-27 ENCOUNTER — Ambulatory Visit: Payer: Medicaid Other | Admitting: Nurse Practitioner

## 2023-04-20 DIAGNOSIS — Z419 Encounter for procedure for purposes other than remedying health state, unspecified: Secondary | ICD-10-CM | POA: Diagnosis not present

## 2023-05-08 ENCOUNTER — Ambulatory Visit: Payer: Medicaid Other | Admitting: Nurse Practitioner

## 2023-05-18 DIAGNOSIS — Z419 Encounter for procedure for purposes other than remedying health state, unspecified: Secondary | ICD-10-CM | POA: Diagnosis not present

## 2023-06-29 DIAGNOSIS — Z419 Encounter for procedure for purposes other than remedying health state, unspecified: Secondary | ICD-10-CM | POA: Diagnosis not present

## 2023-07-23 NOTE — Progress Notes (Unsigned)
 [place], CMA,acting as a Neurosurgeon for Susanna Epley, FNP.,have documented all relevant documentation on the behalf of Susanna Epley, FNP,as directed by  Susanna Epley, FNP while in the presence of Susanna Epley, FNP.  Subjective:    Patient ID: Catherine Cochran , female    DOB: Jul 21, 1995 , 28 y.o.   MRN: [id number]  No chief complaint on file.   HPI  HPI   Past Medical History:  Diagnosis Date   Anemia    Hypertension    Gestational HTN 2018     Family History  Problem Relation Age of Onset   Diabetes Mother    HIV Mother    Asthma Mother    Asthma Brother    Breast cancer Maternal Grandmother      Current Outpatient Medications:    acetaminophen  (TYLENOL ) 500 MG tablet, Take 500 mg by mouth every 6 (six) hours as needed for moderate pain or headache., Disp: , Rfl:    FLUoxetine  (PROZAC ) 10 MG capsule, Take 1 capsule (10 mg total) by mouth daily., Disp: 30 capsule, Rfl: 2   loratadine (CLARITIN) 10 MG tablet, Take 10 mg by mouth daily as needed for allergies., Disp: , Rfl:    norethindrone-ethinyl estradiol-FE (LOESTRIN FE) 1-20 MG-MCG tablet, Take 1 tablet by mouth daily., Disp: 84 tablet, Rfl: 1   Vitamin D , Ergocalciferol , (DRISDOL ) 1.25 MG (50000 UNIT) CAPS capsule, Take 1 capsule (50,000 Units total) by mouth every Sunday. (Patient not taking: Reported on 02/20/2022), Disp: 24 capsule, Rfl: 1   No Known Allergies    The patient states she uses {contraceptive methods:5051} for birth control. No LMP recorded. (Menstrual status: IUD).. {Dysmenorrhea-menorrhagia:21918}. Negative for: breast discharge, breast lump(s), breast pain and breast self exam. Associated symptoms include abnormal vaginal bleeding. Pertinent negatives include abnormal bleeding (hematology), anxiety, decreased libido, depression, difficulty falling sleep, dyspareunia, history of infertility, nocturia, sexual dysfunction, sleep disturbances, urinary incontinence, urinary urgency, vaginal discharge and vaginal  itching. Diet regular.The patient states her exercise level is    . The patient's tobacco use is:  Social History   Tobacco Use  Smoking Status Former   Types: Cigars  Smokeless Tobacco Never  Tobacco Comments   Smoked for 2-3 months and around December 2022.   Aaron Aas She has been exposed to passive smoke. The patient's alcohol use is:  Social History   Substance and Sexual Activity  Alcohol Use Not Currently  . Additional information: Last pap ***, next one scheduled for ***.    Review of Systems   There were no vitals filed for this visit. There is no height or weight on file to calculate BMI.  Wt Readings from Last 3 Encounters:  02/20/22 (!) 345 lb (156.5 kg)  11/27/21 (!) 336 lb 12.8 oz (152.8 kg)  06/21/21 (!) 344 lb 14.4 oz (156.4 kg)     Objective:  Physical Exam      Assessment And Plan:     There are no diagnoses linked to this encounter.   No follow-ups on file. Patient was given opportunity to ask questions. Patient verbalized understanding of the plan and was able to repeat key elements of the plan. All questions were answered to their satisfaction.   Susanna Epley, FNP  I, Susanna Epley, FNP, have reviewed all documentation for this visit. The documentation on 07/23/23 for the exam, diagnosis, procedures, and orders are all accurate and complete.

## 2023-07-24 ENCOUNTER — Ambulatory Visit (INDEPENDENT_AMBULATORY_CARE_PROVIDER_SITE_OTHER): Admitting: Nurse Practitioner

## 2023-07-24 ENCOUNTER — Encounter: Payer: Self-pay | Admitting: Nurse Practitioner

## 2023-07-24 VITALS — BP 120/70 | HR 107 | Temp 98.5°F | Ht 67.0 in | Wt 349.0 lb

## 2023-07-24 DIAGNOSIS — Z23 Encounter for immunization: Secondary | ICD-10-CM | POA: Diagnosis not present

## 2023-07-24 DIAGNOSIS — E782 Mixed hyperlipidemia: Secondary | ICD-10-CM | POA: Diagnosis not present

## 2023-07-24 DIAGNOSIS — R7309 Other abnormal glucose: Secondary | ICD-10-CM | POA: Diagnosis not present

## 2023-07-24 DIAGNOSIS — Z113 Encounter for screening for infections with a predominantly sexual mode of transmission: Secondary | ICD-10-CM

## 2023-07-24 DIAGNOSIS — F419 Anxiety disorder, unspecified: Secondary | ICD-10-CM | POA: Diagnosis not present

## 2023-07-24 DIAGNOSIS — F325 Major depressive disorder, single episode, in full remission: Secondary | ICD-10-CM

## 2023-07-24 DIAGNOSIS — Z6841 Body Mass Index (BMI) 40.0 and over, adult: Secondary | ICD-10-CM | POA: Diagnosis not present

## 2023-07-24 DIAGNOSIS — R239 Unspecified skin changes: Secondary | ICD-10-CM

## 2023-07-24 DIAGNOSIS — Z0000 Encounter for general adult medical examination without abnormal findings: Secondary | ICD-10-CM

## 2023-07-24 DIAGNOSIS — F320 Major depressive disorder, single episode, mild: Secondary | ICD-10-CM

## 2023-07-24 HISTORY — DX: Unspecified skin changes: R23.9

## 2023-07-24 HISTORY — DX: Encounter for immunization: Z23

## 2023-07-24 NOTE — Patient Instructions (Addendum)
 Health Maintenance  Topic Date Due   COVID-19 Vaccine (4 - Mixed Product risk 2024-25 season) 01/24/2024   Pap Smear  11/27/2024   DTaP/Tdap/Td vaccine (3 - Td or Tdap) 06/18/2028   Pneumococcal Vaccination  Completed   Hepatitis C Screening  Completed   HIV Screening  Completed   HPV Vaccine  Aged Out   Meningitis B Vaccine  Aged Out   Flu Shot  Discontinued  If you have any stomach pain or difficulty swallowing call to office [facility] may cause nausea allow time for this to improve Goal to lose 1-2 lbs per week Increase your physical activity and incorporate 2 days of strength training. Goal to exercise 150 minutes per week with at least 2 days of strength training Encouraged to park further when at the store, take stairs instead of elevators and to walk in place during commercials. Increase water intake to at least one gallon of water daily.

## 2023-07-24 NOTE — Assessment & Plan Note (Signed)
 HgbA1c is stable will check A1c today. Continue focusing on healthy diet and regular exercise

## 2023-07-24 NOTE — Assessment & Plan Note (Signed)
 Depression screen score is 0, she is doing well and no longer taking prozac 

## 2023-07-24 NOTE — Assessment & Plan Note (Signed)
 Cholesterol levels are stable, encouraged to eat a low fat diet.

## 2023-07-24 NOTE — Assessment & Plan Note (Signed)
 Chronic Discussed healthy diet and regular exercise options  Encouraged to exercise at least 150 minutes per week with 2 days of strength training Will start [facility] pending insurance approval she is to titrate weekly, discussed side effects of nausea, abdominal pain or difficulty swallowing to notify office. Also discussed importance of using birth control with the use of [facility] teaching done in office. Return in 2 months for weight check. we have started [facility], discussed side effects to include nausea, difficulty swallowing and abdominal pain. She is to titrate as tolerated. Goal to lose 10% body weight in 4 months if approved by insurance

## 2023-07-24 NOTE — Assessment & Plan Note (Signed)
 She has hyperpigmentation to posterior left side of back, may be scabbing however has been present for several months. Will refer to Dermatology for further evaluation. Image in chart

## 2023-07-24 NOTE — Assessment & Plan Note (Signed)
 Behavior modifications discussed and diet history reviewed.   Pt will continue to exercise regularly and modify diet with low GI, plant based foods and decrease intake of processed foods.  Recommend intake of daily multivitamin, Vitamin D, and calcium.  Recommend monthly self breast exams for preventive screenings, as well as recommend immunizations that include influenza, TDAP

## 2023-07-24 NOTE — Assessment & Plan Note (Signed)
 Prevnar 20 given in office.

## 2023-07-24 NOTE — Assessment & Plan Note (Signed)
 Covid 19 vaccine given in office observed for 15 minutes without any adverse reaction

## 2023-07-26 LAB — CBC WITH DIFFERENTIAL/PLATELET
Basophils Absolute: 0.1 10*3/uL (ref 0.0–0.2)
Basos: 1 %
EOS (ABSOLUTE): 0.3 10*3/uL (ref 0.0–0.4)
Eos: 2 %
Hematocrit: 42 % (ref 34.0–46.6)
Hemoglobin: 13.7 g/dL (ref 11.1–15.9)
Immature Grans (Abs): 0 10*3/uL (ref 0.0–0.1)
Immature Granulocytes: 0 %
Lymphocytes Absolute: 3.6 10*3/uL — ABNORMAL HIGH (ref 0.7–3.1)
Lymphs: 25 %
MCH: 29 pg (ref 26.6–33.0)
MCHC: 32.6 g/dL (ref 31.5–35.7)
MCV: 89 fL (ref 79–97)
Monocytes Absolute: 0.7 10*3/uL (ref 0.1–0.9)
Monocytes: 5 %
Neutrophils Absolute: 9.7 10*3/uL — ABNORMAL HIGH (ref 1.4–7.0)
Neutrophils: 67 %
Platelets: 364 10*3/uL (ref 150–450)
RBC: 4.73 x10E6/uL (ref 3.77–5.28)
RDW: 14.5 % (ref 11.7–15.4)
WBC: 14.5 10*3/uL — ABNORMAL HIGH (ref 3.4–10.8)

## 2023-07-26 LAB — HEMOGLOBIN A1C
Est. average glucose Bld gHb Est-mCnc: 123 mg/dL
Hgb A1c MFr Bld: 5.9 % — ABNORMAL HIGH (ref 4.8–5.6)

## 2023-07-26 LAB — HIV ANTIBODY (ROUTINE TESTING W REFLEX): HIV Screen 4th Generation wRfx: NONREACTIVE

## 2023-07-26 LAB — CMP14+EGFR
ALT: 19 IU/L (ref 0–32)
AST: 20 IU/L (ref 0–40)
Albumin: 4.3 g/dL (ref 4.0–5.0)
Alkaline Phosphatase: 93 IU/L (ref 44–121)
BUN/Creatinine Ratio: 18 (ref 9–23)
BUN: 13 mg/dL (ref 6–20)
Bilirubin Total: <0.2 mg/dL (ref 0.0–1.2)
CO2: 21 mmol/L (ref 20–29)
Calcium: 9.7 mg/dL (ref 8.7–10.2)
Chloride: 105 mmol/L (ref 96–106)
Creatinine, Ser: 0.73 mg/dL (ref 0.57–1.00)
Globulin, Total: 2.7 g/dL (ref 1.5–4.5)
Glucose: 95 mg/dL (ref 70–99)
Potassium: 4.1 mmol/L (ref 3.5–5.2)
Sodium: 140 mmol/L (ref 134–144)
Total Protein: 7 g/dL (ref 6.0–8.5)
eGFR: 115 mL/min/{1.73_m2} (ref >59)

## 2023-07-26 LAB — LIPID PANEL
Chol/HDL Ratio: 5.3 ratio — ABNORMAL HIGH (ref 0.0–4.4)
Cholesterol, Total: 185 mg/dL (ref 100–199)
HDL: 35 mg/dL — ABNORMAL LOW (ref >39)
LDL Chol Calc (NIH): 125 mg/dL — ABNORMAL HIGH (ref 0–99)
Triglycerides: 137 mg/dL (ref 0–149)
VLDL Cholesterol Cal: 25 mg/dL (ref 5–40)

## 2023-07-26 LAB — RPR: RPR Ser Ql: NONREACTIVE

## 2023-07-26 LAB — HSV 1 AND 2 AB, IGG
HSV 1 Glycoprotein G Ab, IgG: NONREACTIVE
HSV 2 IgG, Type Spec: NONREACTIVE

## 2023-07-27 LAB — NUSWAB VAGINITIS PLUS (VG+)
Candida albicans, NAA: NEGATIVE
Candida glabrata, NAA: NEGATIVE
Chlamydia trachomatis, NAA: NEGATIVE
Megasphaera 1: HIGH {score} — AB
Neisseria gonorrhoeae, NAA: NEGATIVE
Trich vag by NAA: NEGATIVE

## 2023-07-29 DIAGNOSIS — Z419 Encounter for procedure for purposes other than remedying health state, unspecified: Secondary | ICD-10-CM | POA: Diagnosis not present

## 2023-08-07 ENCOUNTER — Encounter: Payer: Self-pay | Admitting: Nurse Practitioner

## 2023-08-08 MED ORDER — WEGOVY 0.25 MG/0.5ML ~~LOC~~ SOAJ: 0.2500 mg | SUBCUTANEOUS | 0 refills | Status: DC

## 2023-08-14 ENCOUNTER — Telehealth: Payer: Self-pay

## 2023-08-14 ENCOUNTER — Encounter: Admitting: Nurse Practitioner

## 2023-08-14 NOTE — Telephone Encounter (Signed)
 Patient's PA for wegovy 0.25mg  has been submitted through Covermymeds. We are waiting on the determination. YL,RMA

## 2023-08-26 ENCOUNTER — Ambulatory Visit: Payer: Self-pay | Admitting: Nurse Practitioner

## 2023-08-26 MED ORDER — METRONIDAZOLE 500 MG PO TABS: 500.0000 mg | ORAL_TABLET | Freq: Three times a day (TID) | ORAL | 0 refills | Status: AC

## 2023-08-29 DIAGNOSIS — Z419 Encounter for procedure for purposes other than remedying health state, unspecified: Secondary | ICD-10-CM | POA: Diagnosis not present

## 2023-09-11 ENCOUNTER — Ambulatory Visit: Payer: Self-pay | Admitting: Nurse Practitioner

## 2023-09-28 DIAGNOSIS — Z419 Encounter for procedure for purposes other than remedying health state, unspecified: Secondary | ICD-10-CM | POA: Diagnosis not present

## 2023-10-15 ENCOUNTER — Ambulatory Visit: Admitting: Nurse Practitioner

## 2023-10-21 ENCOUNTER — Telehealth: Payer: Self-pay | Admitting: *Deleted

## 2023-10-21 DIAGNOSIS — Z3201 Encounter for pregnancy test, result positive: Secondary | ICD-10-CM

## 2023-10-21 NOTE — Telephone Encounter (Signed)
 Please make sure she/ we alert [facility] of the situation she they can get her in ASAP. Thanks.

## 2023-10-21 NOTE — Telephone Encounter (Signed)
 Messaged was left from Imbler at [facility] regarding this patient.  Upon returning her call, she stated that the patient had a failed IAB and that she has decided to proceed with the pregnancy and the provider at [facility] wanted us  to get her in as soon as possible for prenatal care as by her last lmp she should be 12 weeks.    Call placed to the patient; inquired where her records will be coming from; it is [facility] [place], contact number [phone].  Advised for patient to call them back and request that they write urgent on the cover sheet.  Also advised patient to go to [facility] for urgent concerns in the meantime.    [name], RN

## 2023-10-21 NOTE — Telephone Encounter (Signed)
 Spoke with patient.  Patient reports positive pregnancy test in June. LMP 07/26/23. Approximately [redacted] weeks pregnant.   States she was seen at [facility] on 8/1 to f/u on termination of pregnancy by oral medication, was advised attempt failed. States she was planning for surgery on 8/2, unable to follow through, states they were unable to see her cervix. Patient states she was going to be referred to location in [place].   Patient reports light bleeding. No pain, fever/chills, N/V.   Patient states she would like to get evaluated at this point and proceed with pregnancy, does not want to proceed with termination of pregnancy.   Last AEX 02/20/22 -TW Next AEX 11/12/23 -TW  States last pregnancy 2020, is uncertain where she went. Per review of EPIC, patient was seen by [facility].   Provided number for [facility] located at [address], [place]; [phone]. Patient is going to call to determine if she can establish care. Advised I will review with provider in office and return call if any additional recommendations, patient agreeable.   Shasta -can you review?

## 2023-10-21 NOTE — Telephone Encounter (Signed)
 Call placed to patient.  Patient states she spoke with [facility], was advised they will need records with proof of pregnancy prior to scheduling. Patient states she has requested records, can take 24-48 hrs for records to be sent. Patient will advise once she is scheduled with [facility]. Advised I will also call [facility] to see if we can escalate scheduling. Patient agreeable.   Call placed to [facility] [phone]. Left message on triage line requesting return call.

## 2023-10-21 NOTE — Telephone Encounter (Signed)
 Spoke with Chiquita at [facility], she will reach out to patient directly to confirm location records are coming from and to assist with scheduling.

## 2023-10-23 ENCOUNTER — Telehealth: Payer: Self-pay | Admitting: Family Medicine

## 2023-10-23 NOTE — Telephone Encounter (Signed)
 Patient called to say she was wanting to schedule an appointment to establish care. I informed her we were still waiting on records.

## 2023-10-24 ENCOUNTER — Telehealth: Payer: Self-pay | Admitting: Family Medicine

## 2023-10-24 ENCOUNTER — Telehealth: Payer: Self-pay | Admitting: Pediatrics

## 2023-10-24 NOTE — Telephone Encounter (Signed)
 Received a call from a patient requesting an appointment. She mentioned that a referral was supposed to have been sent over. I informed her that we have not received it yet and explained that referrals are processed in the order they are received through our fax system.  I asked what the referral was for, and she stated it was from the clinic. I then asked if she could clarify the reason for the referral--such as pregnancy or bleeding--but she did not respond and ended the call.

## 2023-10-24 NOTE — Telephone Encounter (Signed)
 Error

## 2023-10-25 NOTE — Telephone Encounter (Signed)
 Reviewed with [name] at [facility]. Still awaiting records and proof of pregnancy for scheduling.   Reviewed with Jami, advised as per [name]. Ok to schedule lab appt today for UPT.   Spoke with patient. Patient states she spoke with [facility] and all information has been faxed to [facility] to include proof of pregnancy. Patient states she has confirmed fax with [facility]. Patient states she will come in for lab if needed. Advised I will f/u with [name] at [facility] and only return call if anything additional needed.   Message to [name], she will f/u with [facility] today for records.

## 2023-10-25 NOTE — Telephone Encounter (Signed)
 Will they not schedule her until they have records from failed TAB?

## 2023-10-25 NOTE — Telephone Encounter (Signed)
 Per [name] at [facility], she has attempted to call [facility] multiple times and placed on hold. No records received to date. She will also try again on Monday.    Routing [employer]

## 2023-10-25 NOTE — Telephone Encounter (Signed)
 F/u with [name] at [facility], they have not received records to date, patient was contacted by their office to notify. Patient has not been scheduled.   Please advise.

## 2023-10-28 ENCOUNTER — Other Ambulatory Visit

## 2023-10-28 NOTE — Telephone Encounter (Signed)
 Per review of EPIC, patient did not show for lab today.   Per review of EPIC, patient scheduled for MyChart appt with [facility] on 8/21 and [facility] on 11/20/23.    Call placed to patient,  left detailed message, ok per dpr. Advised I could see that she was scheduled with [facility]. Advised to return call if anything additional needed.   Lab order cancelled.   Routing to provider for final review. Patient is agreeable to disposition. Will close encounter.

## 2023-10-28 NOTE — Telephone Encounter (Signed)
 Patient contacted front office and scheduled lab appt for today for UPT.   Order placed.    Cc: Annabella, NP

## 2023-10-29 DIAGNOSIS — Z419 Encounter for procedure for purposes other than remedying health state, unspecified: Secondary | ICD-10-CM | POA: Diagnosis not present

## 2023-10-31 ENCOUNTER — Encounter (HOSPITAL_COMMUNITY): Payer: Self-pay | Admitting: *Deleted

## 2023-10-31 ENCOUNTER — Inpatient Hospital Stay (HOSPITAL_COMMUNITY)

## 2023-10-31 ENCOUNTER — Inpatient Hospital Stay (HOSPITAL_COMMUNITY)
Admission: AD | Admit: 2023-10-31 | Discharge: 2023-10-31 | Disposition: A | Discharge status: Home / Self Care | Attending: Obstetrics & Gynecology | Admitting: Obstetrics & Gynecology

## 2023-10-31 DIAGNOSIS — Z3A13 13 weeks gestation of pregnancy: Secondary | ICD-10-CM | POA: Diagnosis not present

## 2023-10-31 DIAGNOSIS — O4690 Antepartum hemorrhage, unspecified, unspecified trimester: Secondary | ICD-10-CM | POA: Diagnosis not present

## 2023-10-31 DIAGNOSIS — O4691 Antepartum hemorrhage, unspecified, first trimester: Secondary | ICD-10-CM | POA: Diagnosis not present

## 2023-10-31 DIAGNOSIS — Z87891 Personal history of nicotine dependence: Secondary | ICD-10-CM | POA: Diagnosis not present

## 2023-10-31 DIAGNOSIS — O209 Hemorrhage in early pregnancy, unspecified: Secondary | ICD-10-CM | POA: Diagnosis not present

## 2023-10-31 DIAGNOSIS — O4692 Antepartum hemorrhage, unspecified, second trimester: Secondary | ICD-10-CM

## 2023-10-31 LAB — CBC
HCT: 35.6 % — ABNORMAL LOW (ref 36.0–46.0)
Hemoglobin: 11.9 g/dL — ABNORMAL LOW (ref 12.0–15.0)
MCH: 28.9 pg (ref 26.0–34.0)
MCHC: 33.4 g/dL (ref 30.0–36.0)
MCV: 86.4 fL (ref 80.0–100.0)
Platelets: 328 K/uL (ref 150–400)
RBC: 4.12 MIL/uL (ref 3.87–5.11)
RDW: 14.5 % (ref 11.5–15.5)
WBC: 12.7 K/uL — ABNORMAL HIGH (ref 4.0–10.5)
nRBC: 0 % (ref 0.0–0.2)

## 2023-10-31 LAB — COMPREHENSIVE METABOLIC PANEL WITH GFR
ALT: 12 U/L (ref 0–44)
AST: 13 U/L — ABNORMAL LOW (ref 15–41)
Albumin: 2.9 g/dL — ABNORMAL LOW (ref 3.5–5.0)
Alkaline Phosphatase: 54 U/L (ref 38–126)
Anion gap: 8 (ref 5–15)
BUN: 6 mg/dL (ref 6–20)
CO2: 21 mmol/L — ABNORMAL LOW (ref 22–32)
Calcium: 8.9 mg/dL (ref 8.9–10.3)
Chloride: 107 mmol/L (ref 98–111)
Creatinine, Ser: 0.58 mg/dL (ref 0.44–1.00)
GFR, Estimated: >60 mL/min (ref >60)
Glucose, Bld: 98 mg/dL (ref 70–99)
Potassium: 3.7 mmol/L (ref 3.5–5.1)
Sodium: 136 mmol/L (ref 135–145)
Total Bilirubin: 0.5 mg/dL (ref 0.0–1.2)
Total Protein: 6.6 g/dL (ref 6.5–8.1)

## 2023-10-31 LAB — URINALYSIS, ROUTINE W REFLEX MICROSCOPIC
Bilirubin Urine: NEGATIVE
Glucose, UA: NEGATIVE mg/dL
Ketones, ur: NEGATIVE mg/dL
Leukocytes,Ua: NEGATIVE
Nitrite: NEGATIVE
Protein, ur: NEGATIVE mg/dL
Specific Gravity, Urine: 1.003 — ABNORMAL LOW (ref 1.005–1.030)
pH: 7 (ref 5.0–8.0)

## 2023-10-31 LAB — HCG, QUANTITATIVE, PREGNANCY: hCG, Beta Chain, Quant, S: 30400 m[IU]/mL — ABNORMAL HIGH (ref <5)

## 2023-10-31 LAB — GC/CHLAMYDIA PROBE AMP (~~LOC~~) NOT AT ARMC
Chlamydia: NEGATIVE
Comment: NEGATIVE
Comment: NORMAL
Neisseria Gonorrhea: NEGATIVE

## 2023-10-31 LAB — WET PREP, GENITAL
Clue Cells Wet Prep HPF POC: NONE SEEN
Sperm: NONE SEEN
Trich, Wet Prep: NONE SEEN
WBC, Wet Prep HPF POC: <10 (ref <10)
Yeast Wet Prep HPF POC: NONE SEEN

## 2023-10-31 LAB — POCT PREGNANCY, URINE: Preg Test, Ur: POSITIVE — AB

## 2023-10-31 NOTE — MAU Note (Signed)
 Catherine Cochran is a 28 y.o. at Unknown here in MAU reporting: Pt took abortion pills JUly 11. They did not work so she decided to keep the baby. Has been having bleeding on and off since. But today bleeding has gotten heavier . Reports mild cramping.   LMP: 07/26/2023 Onset of complaint: today Pain score: 2 Vitals:   10/31/23 0338  BP: (!) 128/97  Pulse: 90  Resp: 18  Temp: 98.3 F (36.8 C)     FHT: unable to doppler FHR  Lab orders placed from triage: UPT ( unable to give urine sample

## 2023-10-31 NOTE — Discharge Instructions (Signed)
   [facility] [employer] for [employer] at [facility]  [address] [place], [place] [address]  [phone]  Center for [facility] Healthcare at [facility]  [address], [place], [place] [address]  [phone]  Center for [facility] Healthcare at [facility] [address] [place], [place] [address]  [phone]  Center for [facility] Healthcare at [facility] [address], [place], [place] [address] [phone]  Center for [facility] Healthcare at [facility]  [address] [place], [place] [address]  [phone]  Center for [facility] Healthcare at [facility]  [address] (First floor), [place], [place] [address]  [phone]  Center for [facility] at [facility]  [address] [place], [place], [place] [address]  [phone]  [facility]  [address], [place], [place] [address]  [phone]  [facility]  [address] [place], [place], [place] [address]  [phone]  [facility]  [address] [place], [place] [address]  [phone]  [facility]  [address] [place], [place] [address]  [phone]  [facility]  [address], [place], [place] [address]  [phone]  [facility]   [address] [place], [place] [address]  [phone]  [facility]  [address] [place], [place] [address]   [phone]  [facility] [address], [place], [place] [address] [phone]  [facility]  [address] [place], [place] [address]  [phone]

## 2023-10-31 NOTE — MAU Provider Note (Signed)
 History     CSN: [id number]  Arrival date and time: 10/31/23 9680   Event Date/Time   First Provider Initiated Contact with Patient 10/31/23 0421      Chief Complaint  Patient presents with   Vaginal Bleeding    Catherine Cochran is a 28 y.o. G4P3003 at Unknown whose LMP was Jul 26, 2023.  She presents today for vaginal bleeding.  She states the bleeding has been on and off since trying to get an abortion, that failed, in July. She states she received her abortion pills from [facility]. She states she received an US  after that showed that fetus was present and was instructed to go to [facility] for surgical procedure.  However, she states she did not.   She reprots the bleeding has been light on and off, but today it was really heavy.  She states she is not passing clots that she can tell. She reports some light cramps that she rates a 2/10.   OB History     Gravida  3   Para  3   Term  3   Preterm      AB      Living  3      SAB      IAB      Ectopic      Multiple  0   Live Births  3           Past Medical History:  Diagnosis Date   Anemia    Anemia in pregnancy 06/02/2018   Anxiety    GBS (group B Streptococcus carrier), +RV culture, currently pregnant 08/18/2018   H/O: C-section 08/25/2018   History of 2 cesarean sections 03/21/2018   2017  Due to fetal intolerance of labor;  2018 Elective C/S due to bleeding during labor     History of gestational hypertension 03/21/2018   Hypertension    Gestational HTN 2018   IUD (intrauterine device) in place 08/25/2018   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 It  has not been reported as a cause of HDN. Titer not indicated.      Obesity in pregnancy 04/03/2018   Status post repeat low transverse cesarean section 08/25/2018   Supervision of high risk pregnancy, antepartum 03/21/2018              Nursing Staff    Provider       Office Location     [facility]    Dating     LMP certain       Language     English    Anatomy US      normal      Flu Vaccine     Declined    Genetic Screen     NIPS: Low risk   AFP: Negative         TDaP vaccine      06/19/18    Hgb A1C or   GTT       Third trimester: neg      Rhogam     n/a         LAB RESULTS       Feeding Plan    Breast    Blood Type    B/Po    Past Surgical History:  Procedure Laterality Date   CESAREAN SECTION  2017, 2018   CESAREAN SECTION N/A 08/25/2018   Procedure: CESAREAN SECTION;  Surgeon: Nicholaus Burnard HERO, MD;  Location[facility] LD [facility];  Service: Obstetrics;  Laterality: N/A;   TONSILLECTOMY     2nd grade   WISDOM TOOTH EXTRACTION      Family History  Problem Relation Age of Onset   Diabetes Mother    HIV Mother    Asthma Mother    Asthma Brother    Breast cancer Maternal Grandmother     Social History   Tobacco Use   Smoking status: Former    Types: Cigars   Smokeless tobacco: Never   Tobacco comments:    Smoked for 2-3 months and around December 2022.   Vaping Use   Vaping status: Never Used  Substance Use Topics   Alcohol use: Not Currently   Drug use: Never    Allergies: No Known Allergies  Medications Prior to Admission  Medication Sig Dispense Refill Last Dose/Taking   Semaglutide -Weight Management (WEGOVY ) 0.25 MG/0.5ML SOAJ Inject 0.25 mg into the skin once a week. 2 mL 0     Review of Systems  Constitutional:  Negative for chills and fever.  Gastrointestinal:  Positive for abdominal pain (Mild cramping), constipation (BM last night) and nausea (None currently). Negative for diarrhea and vomiting.  Genitourinary:  Positive for vaginal bleeding. Negative for difficulty urinating, dysuria and vaginal discharge.  Neurological:  Positive for light-headedness (Currently). Negative for dizziness and headaches.   Physical Exam   Blood pressure (!) 128/97, pulse 90, temperature 98.3 F (36.8 C), resp. rate 18, height 5' 6 (1.676 m), weight (!)  147.4 kg, last menstrual period 07/26/2023.  Physical Exam Vitals reviewed.  Constitutional:      General: She is not in acute distress.    Appearance: Normal appearance. She is obese.  HENT:     Head: Normocephalic and atraumatic.  Eyes:     Conjunctiva/sclera: Conjunctivae normal.  Cardiovascular:     Rate and Rhythm: Normal rate.  Pulmonary:     Effort: Pulmonary effort is normal. No respiratory distress.  Musculoskeletal:        General: Normal range of motion.     Cervical back: Normal range of motion.  Neurological:     Mental Status: She is alert and oriented to person, place, and time.  Psychiatric:        Mood and Affect: Mood normal.        Behavior: Behavior normal.     MAU Course  Procedures Results for orders placed or performed during the hospital encounter of 10/31/23 (from the past 24 hours)  Pregnancy, urine POC     Status: Abnormal   Collection Time: 10/31/23  4:08 AM  Result Value Ref Range   Preg Test, Ur POSITIVE (A) NEGATIVE  hCG, quantitative, pregnancy     Status: Abnormal   Collection Time: 10/31/23  4:25 AM  Result Value Ref Range   hCG, Beta Chain, Quant, S 30,400 (H) <5 mIU/mL  CBC     Status: Abnormal   Collection Time: 10/31/23  4:25 AM  Result Value Ref Range   WBC 12.7 (H) 4.0 - 10.5 K/uL   RBC 4.12 3.87 - 5.11 MIL/uL   Hemoglobin 11.9 (L) 12.0 - 15.0 g/dL   HCT 64.3 (L) 63.9 - 53.9 %   MCV 86.4 80.0 - 100.0 fL   MCH 28.9 26.0 - 34.0 pg   MCHC 33.4 30.0 - 36.0 g/dL   RDW 85.4 88.4 - 84.4 %   Platelets 328 150 - 400 K/uL   nRBC 0.0 0.0 -  0.2 %  Comprehensive metabolic panel     Status: Abnormal   Collection Time: 10/31/23  4:25 AM  Result Value Ref Range   Sodium 136 135 - 145 mmol/L   Potassium 3.7 3.5 - 5.1 mmol/L   Chloride 107 98 - 111 mmol/L   CO2 21 (L) 22 - 32 mmol/L   Glucose, Bld 98 70 - 99 mg/dL   BUN 6 6 - 20 mg/dL   Creatinine, Ser 9.41 0.44 - 1.00 mg/dL   Calcium 8.9 8.9 - 89.6 mg/dL   Total Protein 6.6 6.5 - 8.1  g/dL   Albumin 2.9 (L) 3.5 - 5.0 g/dL   AST 13 (L) 15 - 41 U/L   ALT 12 0 - 44 U/L   Alkaline Phosphatase 54 38 - 126 U/L   Total Bilirubin 0.5 0.0 - 1.2 mg/dL   GFR, Estimated >39 >39 mL/min   Anion gap 8 5 - 15  Wet prep, genital     Status: None   Collection Time: 10/31/23  5:21 AM  Result Value Ref Range   Yeast Wet Prep HPF POC NONE SEEN NONE SEEN   Trich, Wet Prep NONE SEEN NONE SEEN   Clue Cells Wet Prep HPF POC NONE SEEN NONE SEEN   WBC, Wet Prep HPF POC <10 <10   Sperm NONE SEEN    US  OB Comp Less 14 Wks Result Date: 10/31/2023 EXAM: OBSTETRIC ULTRASOUND FIRST TRIMESTER CLINICAL HISTORY: [id number] Vaginal bleeding in pregnancy [id number]. Vaginal bleeding in pregnancy TECHNIQUE: Transabdominal first trimester obstetric pelvic duplex ultrasound was performed with real-time imaging, color flow Doppler imaging, and spectral analysis. COMPARISON: None provided. FINDINGS: UTERUS: No focal myometrial mass. GESTATIONAL SAC(S): Single intrauterine gestational sac containing an embryo with cardiac activity. No subchorionic hemorrhage. YOLK SAC: Not visualized. EMBRYO(<11WK) /FETUS(>=11WK): Single embryo. CROWN RUMP LENGTH: 69.1 mm with a gestational age of [redacted] weeks and 1 day. RATE OF CARDIAC ACTIVITY: 159 beats per minute. RIGHT OVARY: Suboptimal visualization. LEFT OVARY: Normal. FREE FLUID: No free fluid. MEASUREMENTS ESTIMATED GESTATIONAL AGE BY CURRENT ULTRASOUND: 13 weeks and 1 day ESTIMATED GESTATIONAL AGE BY LMP/PRIOR ULTRASOUND: 05/06/24 ESTIMATED DUE DATE: 05/06/24 IMPRESSION: 1. Single intrauterine gestational sac containing an embryo with cardiac activity, consistent with a gestational age of [redacted] weeks and 1 day. No yolk sac visualized. 2. No subchorionic hemorrhage. Electronically signed by: Waddell Calk MD 10/31/2023 05:28 AM EDT RP Workstation: [id number]     MDM Physical Exam Cultures: Wet Prep and GC/CT Labs: UA, UPT, CBC, CMP, hCG Ultrasound Assessment and Plan  28 year  old [id number]  Vaginal Bleeding Failed Abortion?  -Reviewed POC with patient. -Exam performed.  -Labs ordered. -Cultures to be collected by patient. -Send for US  and await results.   Harlene LITTIE Duncans 10/31/2023, 4:21 AM   Reassessment (5:42 AM) -Results as above. -Informed of findings c/w SIUP with FHR. -Patient requests and shown live imaging/recording.  -Patient states she plans to keep pregnancy.  -Reassured that no [facility] noted on US  and that some bleeding can occur in early pregnancy. Bleeding precautions reviewed.  -UA pending, but will send results via mychart if abnormal.  -Given list of community providers and encouraged to initiate care.  -Discharged to home in stable condition.  Harlene LITTIE Duncans MSN, CNM Advanced Practice Provider, Center for [employer]

## 2023-11-01 ENCOUNTER — Inpatient Hospital Stay (HOSPITAL_COMMUNITY)

## 2023-11-01 ENCOUNTER — Inpatient Hospital Stay (HOSPITAL_COMMUNITY)
Admission: AD | Admit: 2023-11-01 | Discharge: 2023-11-01 | Disposition: A | Discharge status: Home / Self Care | Attending: Obstetrics and Gynecology | Admitting: Obstetrics and Gynecology

## 2023-11-01 ENCOUNTER — Encounter (HOSPITAL_COMMUNITY): Payer: Self-pay | Admitting: Obstetrics and Gynecology

## 2023-11-01 DIAGNOSIS — O4692 Antepartum hemorrhage, unspecified, second trimester: Secondary | ICD-10-CM

## 2023-11-01 DIAGNOSIS — O209 Hemorrhage in early pregnancy, unspecified: Secondary | ICD-10-CM | POA: Diagnosis present

## 2023-11-01 DIAGNOSIS — O468X1 Other antepartum hemorrhage, first trimester: Secondary | ICD-10-CM

## 2023-11-01 DIAGNOSIS — O4691 Antepartum hemorrhage, unspecified, first trimester: Secondary | ICD-10-CM

## 2023-11-01 DIAGNOSIS — O418X10 Other specified disorders of amniotic fluid and membranes, first trimester, not applicable or unspecified: Secondary | ICD-10-CM

## 2023-11-01 DIAGNOSIS — Z3A13 13 weeks gestation of pregnancy: Secondary | ICD-10-CM | POA: Diagnosis not present

## 2023-11-01 DIAGNOSIS — I10 Essential (primary) hypertension: Secondary | ICD-10-CM | POA: Diagnosis not present

## 2023-11-01 DIAGNOSIS — Z743 Need for continuous supervision: Secondary | ICD-10-CM | POA: Diagnosis not present

## 2023-11-01 DIAGNOSIS — O074 Failed attempted termination of pregnancy without complication: Secondary | ICD-10-CM | POA: Diagnosis present

## 2023-11-01 DIAGNOSIS — R109 Unspecified abdominal pain: Secondary | ICD-10-CM | POA: Diagnosis present

## 2023-11-01 DIAGNOSIS — O418X20 Other specified disorders of amniotic fluid and membranes, second trimester, not applicable or unspecified: Secondary | ICD-10-CM

## 2023-11-01 DIAGNOSIS — R58 Hemorrhage, not elsewhere classified: Secondary | ICD-10-CM | POA: Diagnosis not present

## 2023-11-01 NOTE — MAU Note (Signed)
 Catherine Cochran is a 28 y.o. at [redacted]w[redacted]d here in MAU reporting: pt came in by EMS.  Bleeding and cramping, post abortion pill. Bleeding again when taken to triage.  Sent to restroom to change pad.  Was here yesterday morning because of heavy bleeding. Had taken abortion at least a month ago, but they didn't work.  Still had a heart beat yesterday. Size consistent with dates.  Had some pain around 1030, laid down, was better.  But when she got up, she felt the blood come down, completely soaked the pad and shorts. (This was first pad since yesterday)Had not soaked pad when used restroom now. Is starting to feel the sharp pains again, mostly on left side.  Onset of complaint: ongoing Pain score: 5 Vitals:   11/01/23 1244  BP: (!) 141/78  Pulse: 93  Resp: 18  Temp: 98.3 F (36.8 C)  SpO2: 99%     QYU:lwjaoz due to clothing and habitus Lab orders placed from triage:

## 2023-11-01 NOTE — Discharge Instructions (Signed)
OB/Gyn Offices in [place] Area          [facility]  [address] [phone]  [facility]   [address]  [phone]  [facility] [place]       [address] [phone]            [facility] [place]     [address] [phone]          [facility] [place]   [address] [phone]  [facility]   [address], [place], [place] [address] [phone]     [facility]  [address]   [phone]     [facility]  Phone: [phone]  [facility]  Phone: [phone]  [facility] Phone: [phone]  [facility] Phone: [phone]  [facility] Phone: [phone]  [facility] Phone: [phone]  [facility]  Phone: [phone]  

## 2023-11-01 NOTE — MAU Provider Note (Cosign Needed Addendum)
 History     CSN: [id number]  Arrival date and time: 11/01/23 1201   Event Date/Time   First Provider Initiated Contact with Patient 11/01/23 1408      Chief Complaint  Patient presents with   Vaginal Bleeding   Abdominal Pain    Vaginal Bleeding Associated symptoms include abdominal pain. Pertinent negatives include no chest pain, chills or fever.  Abdominal Pain The primary symptoms of the illness include abdominal pain and vaginal bleeding. The primary symptoms of the illness do not include fever, shortness of breath, diarrhea or dysuria.  Symptoms associated with the illness do not include chills, constipation or back pain.    Catherine Cochran is a 28 y.o. G4P3003 at [redacted]w[redacted]d who presents for evaluation of vaginal bleeding. Patient was seen yesterday in MAU for continued bleeding after a failed medical abortion. SIUP at 13 weeks with FHR was seen on ultrasound with negative swabs for infections. Patient reports feeling a sharp pain this morning on her left side so she laid down for a while. When she later got out of bed, she felt a big gush of blood that went through her pad and pants that ran down her legs. She has had spotting and mild cramping since then. No recent intercourse.   Patient rates the pain as a 4/10 and has not tried anything for the pain.   She denies any discharge. Denies any constipation, diarrhea or any urinary complaints.   OB History     Gravida  4   Para  3   Term  3   Preterm      AB      Living  3      SAB      IAB      Ectopic      Multiple  0   Live Births  3           Past Medical History:  Diagnosis Date   Anemia    Anemia in pregnancy 06/02/2018   Anxiety    GBS (group B Streptococcus carrier), +RV culture, currently pregnant 08/18/2018   H/O: C-section 08/25/2018   History of 2 cesarean sections 03/21/2018   2017  Due to fetal intolerance of labor;  2018 Elective C/S due to bleeding during labor     History of gestational  hypertension 03/21/2018   Hypertension    Gestational HTN 2018   IUD (intrauterine device) in place 08/25/2018   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 It  has not been reported as a cause of HDN. Titer not indicated.      Obesity in pregnancy 04/03/2018   Status post repeat low transverse cesarean section 08/25/2018   Supervision of high risk pregnancy, antepartum 03/21/2018              Nursing Staff    Provider      Office Location     [facility]    Dating     LMP certain       Language     English    Anatomy US      normal      Flu Vaccine     Declined    Genetic Screen     NIPS: Low risk   AFP: Negative         TDaP vaccine      06/19/18    Hgb A1C or   GTT  Third trimester: neg      Rhogam     n/a         LAB RESULTS       Feeding Plan    Breast    Blood Type    B/Po    Past Surgical History:  Procedure Laterality Date   CESAREAN SECTION  2017, 2018   CESAREAN SECTION N/A 08/25/2018   Procedure: CESAREAN SECTION;  Surgeon: Nicholaus Burnard HERO, MD;  [facility]: [facility];  Service: Obstetrics;  Laterality: N/A;   TONSILLECTOMY     2nd grade   WISDOM TOOTH EXTRACTION      Family History  Problem Relation Age of Onset   Diabetes Mother    HIV Mother    Asthma Mother    Asthma Brother    Breast cancer Maternal Grandmother     Social History   Tobacco Use   Smoking status: Former    Types: Cigars   Smokeless tobacco: Never   Tobacco comments:    Smoked for 2-3 months and around December 2022.   Vaping Use   Vaping status: Never Used  Substance Use Topics   Alcohol use: Not Currently   Drug use: Never    Allergies: No Known Allergies  No medications prior to admission.    Review of Systems  Constitutional:  Negative for chills and fever.  Respiratory:  Negative for shortness of breath.   Cardiovascular:  Negative for chest pain.  Gastrointestinal:  Positive for abdominal pain. Negative for  constipation and diarrhea.  Genitourinary:  Positive for vaginal bleeding. Negative for dysuria and pelvic pain.  Musculoskeletal:  Negative for back pain.   Physical Exam   Blood pressure (!) 141/78, pulse 93, temperature 98.3 F (36.8 C), temperature source Oral, resp. rate 18, height 5' 6 (1.676 m), weight (!) 147.2 kg, last menstrual period 07/26/2023, SpO2 99%.  Patient Vitals for the past 24 hrs:  BP Temp Temp src Pulse Resp SpO2 Height Weight  11/01/23 1244 (!) 141/78 98.3 F (36.8 C) Oral 93 18 99 % 5' 6 (1.676 m) (!) 147.2 kg    Physical Exam Vitals reviewed. Exam conducted with a chaperone present.  Constitutional:      General: She is not in acute distress.    Appearance: She is well-developed.  Cardiovascular:     Rate and Rhythm: Normal rate.  Pulmonary:     Effort: Pulmonary effort is normal.  Abdominal:     General: Abdomen is flat.     Palpations: Abdomen is soft.     Tenderness: There is no abdominal tenderness.  Genitourinary:    General: Normal vulva.     Comments: Scant amount of bright red blood present in vaginal vault Skin:    General: Skin is warm and dry.  Neurological:     Mental Status: She is alert and oriented to person, place, and time.  Psychiatric:        Mood and Affect: Mood normal.        Behavior: Behavior normal.    FHR 150bpm via bedside ultrasound  MAU Course  Procedures   US  OB Comp Less 14 Wks Result Date: 11/01/2023 CLINICAL DATA:  Vaginal bleeding and cramps after taking abortion pills. 13 weeks and 2 days pregnant by previous ultrasound. Quantitative beta hCG yesterday of 30,400. EXAM: OBSTETRIC <14 WK ULTRASOUND TECHNIQUE: Transabdominal ultrasound was performed for evaluation of the gestation as well as the maternal uterus and adnexal regions. COMPARISON:  10/31/2023 FINDINGS: Intrauterine gestational  sac: Visualized Yolk sac:  Not visualized Embryo:  Visualized Cardiac Activity: Visualized Heart Rate: 149 bpm CRL:   66.2  mm   12 w 6 d                  US  EDC: 05/09/2024 Subchorionic hemorrhage:  Small Maternal uterus/adnexae: Neither ovary visualized. No free peritoneal fluid. IMPRESSION: 1. Single live intrauterine gestation measuring slightly smaller than yesterday, with an estimated gestational age of [redacted] weeks and 6 days today. 2. Slight decrease in fetal heart rate from 159 beats per minute yesterday to 147 beats per minute today. 3. Interval small subchorionic hemorrhage. Electronically Signed   By: Elspeth Bathe M.D.   On: 11/01/2023 15:28     MDM Prenatal records from community office reviewed.  Labs ordered and reviewed.   Scant blood noticed in vaginal vault with spec exam. Continued spotting in MAU U/S w/ subchorionic hematoma. Stable for discharge   Assessment and Plan   1. Subchorionic hematoma in second trimester, single or unspecified fetus   2. [redacted] weeks gestation of pregnancy   3. Vaginal bleeding in pregnancy, second trimester     -Discharge home in stable condition -Return precautions discussed -Patient advised to follow-up with OB provider to initiate care -Patient may return to MAU as needed or if her condition were to change or worsen   Elenor Mole, SWHNP 11/01/2023  Midwife Attestation:  I personally saw and evaluated the patient, performing the key elements of the service. I developed and verified the management plan that is described in the resident's/student's note, and I agree with the content with my edits above. VSS, HRR&R, Resp unlabored, Legs neg.    Olam Boards, CNM 10:19 PM

## 2023-11-06 ENCOUNTER — Ambulatory Visit: Admitting: Nurse Practitioner

## 2023-11-07 ENCOUNTER — Telehealth (INDEPENDENT_AMBULATORY_CARE_PROVIDER_SITE_OTHER)

## 2023-11-07 DIAGNOSIS — Z3A14 14 weeks gestation of pregnancy: Secondary | ICD-10-CM

## 2023-11-07 DIAGNOSIS — Z3490 Encounter for supervision of normal pregnancy, unspecified, unspecified trimester: Secondary | ICD-10-CM | POA: Diagnosis not present

## 2023-11-07 DIAGNOSIS — Z3480 Encounter for supervision of other normal pregnancy, unspecified trimester: Secondary | ICD-10-CM | POA: Insufficient documentation

## 2023-11-07 DIAGNOSIS — Z3482 Encounter for supervision of other normal pregnancy, second trimester: Secondary | ICD-10-CM

## 2023-11-07 MED ORDER — BLOOD PRESSURE KIT DEVI: 1.0000 | 0 refills | Status: AC | PRN

## 2023-11-07 MED ORDER — GOJJI WEIGHT SCALE MISC
1.0000 | 0 refills | Status: AC | PRN
Start: 2023-11-07 — End: ?

## 2023-11-07 NOTE — Progress Notes (Signed)
 New OB Intake  I connected with Catherine Cochran  on 11/07/23 at  9:15 AM EDT by MyChart Video Visit and verified that I am speaking with the correct person using two identifiers. Nurse is located at [facility] and pt is located at home.  I discussed the limitations, risks, security and privacy concerns of performing an evaluation and management service by telephone and the availability of in person appointments. I also discussed with the patient that there may be a patient responsible charge related to this service. The patient expressed understanding and agreed to proceed.  I explained I am completing New OB Intake today. We discussed EDD of 05/06/2024 based on US  at 13 weeks. Pt is G5P4004. I reviewed her allergies, medications and Medical/Surgical/OB history.   Patient Active Problem List   Diagnosis Date Noted   Supervision of other normal pregnancy, antepartum 11/07/2023   Mixed hyperlipidemia 07/24/2023   Morbid obesity with BMI of 50.0-59.9, adult (HCC) 07/24/2023   Anxiety 07/24/2023   MDD (recurrent major depressive disorder) in remission (HCC) 04/03/2022   Leukocytosis 06/21/2021   Body mass index (BMI) 50.0-59.9, adult (HCC) 06/02/2021   Major depression in remission (HCC) 05/24/2021   History of cesarean section 08/25/2018   Hx of Gestational hypertension 04/18/2017     Concerns addressed today  Delivery Plans Plans to deliver at [facility]. Discussed the nature of our practice with multiple providers including residents and students as well as female and female providers. Due to the size of the practice, the delivering provider may not be the same as those providing prenatal care.   Patient is not interested in water birth.  MyChart/Babyscripts MyChart access verified. I explained pt will have some visits in office and some virtually. Babyscripts instructions given and order placed. Patient verifies receipt of registration text/e-mail. Account successfully created and app downloaded.  If patient is a candidate for Optimized scheduling, add to sticky note.   Blood Pressure Cuff/Weight Scale Blood pressure cuff ordered for patient to pick-up from [employer]. Explained after first prenatal appt pt will check weekly and document in Babyscripts. Patient does not have weight scale; order sent to [facility], patient may track weight weekly in Babyscripts.  Anatomy US  Explained first scheduled US  will be around 19 weeks. Anatomy US  scheduled for 12/19/2023 at 800AM.  Is patient a CenteringPregnancy candidate?  Declined Declined due to Schedule  Is patient a Mom+Baby Combined Care candidate?  Not a candidate   If accepted, confirm patient does not intend to move from the area for at least 12 months, then notify Mom+Baby staff  Is patient a candidate for Babyscripts Optimization? Yes, patient declined   First visit review I reviewed new OB appt with patient. Explained pt will be seen by Norleen Rover MD at first visit. Discussed [place] genetic screening with patient. Yes [facility] and [facility].. Routine prenatal labs is not   Last Pap Diagnosis  Date Value Ref Range Status  11/27/2021   Final   - Negative for intraepithelial lesion or malignancy (NILM)    Ermalinda DEEM, CMA 11/07/2023  9:32 AM

## 2023-11-12 ENCOUNTER — Ambulatory Visit: Admitting: Nurse Practitioner

## 2023-11-12 NOTE — Progress Notes (Deleted)
   Catherine Cochran 1995/06/24 [id number]   History:  28 y.o. G *** presents for annual exam.  Gynecologic History Patient's last menstrual period was 07/26/2023.   Contraception/Family planning: {method:5051} Sexually active: ***  Health Maintenance Last Pap: ***. Results were:  Last mammogram: ***. Results were:  Last colonoscopy: ***. Results were:  Last Dexa: ***. Results were:      07/24/2023    8:28 AM  Depression screen PHQ 2/9  Decreased Interest 0  Down, Depressed, Hopeless 0  PHQ - 2 Score 0  Altered sleeping 0  Tired, decreased energy 0  Change in appetite 0  Feeling bad or failure about yourself  0  Trouble concentrating 0  Moving slowly or fidgety/restless 0  Suicidal thoughts 0  PHQ-9 Score 0  Difficult doing work/chores Not difficult at all     Past medical history, past surgical history, family history and social history were all reviewed and documented in the EPIC chart.  ROS:  A ROS was performed and pertinent positives and negatives are included.  Exam:  There were no vitals filed for this visit. There is no height or weight on file to calculate BMI.  General appearance:  Normal Thyroid :  Symmetrical, normal in size, without palpable masses or nodularity. Respiratory  Auscultation:  Clear without wheezing or rhonchi Cardiovascular  Auscultation:  Regular rate, without rubs, murmurs or gallops  Edema/varicosities:  Not grossly evident Abdominal  Soft,nontender, without masses, guarding or rebound.  Liver/spleen:  No organomegaly noted  Hernia:  None appreciated  Skin  Inspection:  Grossly normal Breasts: Examined lying and sitting.   Right: Without masses, retractions, nipple discharge or axillary adenopathy.   Left: Without masses, retractions, nipple discharge or axillary adenopathy. Pelvic: External genitalia:  no lesions              Urethra:  normal appearing urethra with no masses, tenderness or lesions              Bartholins and Skenes:  normal                 Vagina: normal appearing vagina with normal color and discharge, no lesions              Cervix: no lesions Bimanual Exam:  Uterus:  no masses or tenderness              Adnexa: no mass, fullness, tenderness              Rectovaginal: Deferred              Anus:  normal, no lesions  Catherine Cochran, CMA present as chaperone.   Assessment/Plan:  28 y.o. G *** for annual exam.    No follow-ups on file.   Catherine DELENA Shutter DNP, 8:03 AM 11/12/2023

## 2023-11-15 ENCOUNTER — Inpatient Hospital Stay (HOSPITAL_COMMUNITY)
Admission: AD | Admit: 2023-11-15 | Discharge: 2023-11-15 | Disposition: A | Discharge status: Home / Self Care | Payer: Self-pay | Attending: Obstetrics and Gynecology | Admitting: Obstetrics and Gynecology

## 2023-11-15 ENCOUNTER — Other Ambulatory Visit: Payer: Self-pay

## 2023-11-15 DIAGNOSIS — O26852 Spotting complicating pregnancy, second trimester: Secondary | ICD-10-CM | POA: Diagnosis not present

## 2023-11-15 DIAGNOSIS — Z3A15 15 weeks gestation of pregnancy: Secondary | ICD-10-CM | POA: Diagnosis not present

## 2023-11-15 DIAGNOSIS — O26892 Other specified pregnancy related conditions, second trimester: Secondary | ICD-10-CM | POA: Diagnosis not present

## 2023-11-15 DIAGNOSIS — O34219 Maternal care for unspecified type scar from previous cesarean delivery: Secondary | ICD-10-CM | POA: Diagnosis not present

## 2023-11-15 DIAGNOSIS — O99212 Obesity complicating pregnancy, second trimester: Secondary | ICD-10-CM | POA: Diagnosis not present

## 2023-11-15 DIAGNOSIS — O26899 Other specified pregnancy related conditions, unspecified trimester: Secondary | ICD-10-CM

## 2023-11-15 DIAGNOSIS — N939 Abnormal uterine and vaginal bleeding, unspecified: Secondary | ICD-10-CM | POA: Diagnosis not present

## 2023-11-15 DIAGNOSIS — R102 Pelvic and perineal pain: Secondary | ICD-10-CM

## 2023-11-15 LAB — URINALYSIS, MICROSCOPIC (REFLEX)

## 2023-11-15 LAB — URINALYSIS, ROUTINE W REFLEX MICROSCOPIC
Bilirubin Urine: NEGATIVE
Glucose, UA: NEGATIVE mg/dL
Ketones, ur: NEGATIVE mg/dL
Leukocytes,Ua: NEGATIVE
Nitrite: NEGATIVE
Protein, ur: 100 mg/dL — AB
Specific Gravity, Urine: >1.03 — ABNORMAL HIGH (ref 1.005–1.030)
pH: 6 (ref 5.0–8.0)

## 2023-11-15 MED ORDER — CYCLOBENZAPRINE HCL 10 MG PO TABS: 10.0000 mg | ORAL_TABLET | Freq: Two times a day (BID) | ORAL | 0 refills | Status: DC | PRN

## 2023-11-15 NOTE — MAU Provider Note (Signed)
 Chief Complaint:  Vaginal Bleeding, Abdominal Pain, and Back Pain   HPI   Catherine Cochran is a 28 y.o. G5P4004 at [redacted]w[redacted]d who presents to maternity admissions reporting reporting that she had some left lower  abdominal pain while she was going up the stairs earlier today which has since resolved.  Patient states she also has a known subchorionic hematoma and is still having some pinkish to dark blood when wiping after using the bathroom.  She denies  any active vaginal bleeding on a pad or on her underwear.  Patient denies any vaginal discharge, urinary discomfort, burning or irritation.  She denies fever, chills, nausea or vomiting.  Her pregnancy is complicated by history of a cesarean section, morbid obesity with BMI greater than 50, anxiety, MDD, and known small subchorionic hematoma on early pregnancy ultrasound performed on 11/01/2023   P[facility]: [facility]  Past Medical History:  Diagnosis Date   Anemia    Anemia in pregnancy 06/02/2018   Anxiety    GBS (group B Streptococcus carrier), +RV culture, currently pregnant 08/18/2018   H/O: C-section 08/25/2018   History of 2 cesarean sections 03/21/2018   2017  Due to fetal intolerance of labor;  2018 Elective C/S due to bleeding during labor     History of gestational hypertension 03/21/2018   Hypertension    Gestational HTN 2018   IUD (intrauterine device) in place 08/25/2018   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 It  has not been reported as a cause of HDN. Titer not indicated.      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 F/U US  12/04 Apr 2017: EFW 40%, follow up scheduled for Feb 18th Last Assessment & Plan: Formatting of this note might be  different from the original. Most recent growth ultrasound 2/4: - EFW 1763g, 3#14, GP 28th% - repeat growth next week - patient receiving twice weekly testing for gestational hypertension diagnosis     Skin change 07/24/2023   Status post repeat low transverse cesarean section 08/25/2018   Supervision of high risk pregnancy, antepartum 03/21/2018              Nursing Staff    Provider      Office Location     [facility]    Dating     LMP certain       Language     English    Anatomy US      normal      Flu Vaccine     Declined    Genetic Screen     NIPS: Low risk   AFP: Negative         TDaP vaccine      06/19/18    Hgb A1C or   GTT       Third trimester: neg      Rhogam     n/a         LAB RESULTS       Feeding Plan    Breast    Blood Type    B/Po   OB History  Gravida Para Term Preterm AB Living  5 4 4   4   SAB IAB Ectopic Multiple Live Births      4    #  Outcome Date GA Lbr Len/2nd Weight Sex Type Anes PTL Lv  5 Current           4 Term 08/25/18 [redacted]w[redacted]d  2900 g F CS-LTranv Spinal  LIV  3 Term 05/24/17 [redacted]w[redacted]d  2591 g F CS-LTranv EPI N LIV     Birth Comments: 2 vessel cord     Complications: Gestational hypertension  2 Term 02/28/16 [redacted]w[redacted]d  2920 g M CS-LTranv EPI N LIV     Complications: Fetal Intolerance  1 Term 10/29/14 [redacted]w[redacted]d  3430 g F Vag-Spont EPI N LIV   Past Surgical History:  Procedure Laterality Date   CESAREAN SECTION  2017, 2018   CESAREAN SECTION N/A 08/25/2018   Procedure: CESAREAN SECTION;  Surgeon: Nicholaus Burnard HERO, MD;  Location: [facility];  Service: Obstetrics;  Laterality: N/A;   CESAREAN SECTION     TONSILLECTOMY     2nd grade   WISDOM TOOTH EXTRACTION     Family History  Problem Relation Age of Onset   Diabetes Mother    HIV Mother    Asthma Mother    Asthma Brother    Breast cancer Maternal Grandmother    Social History   Tobacco Use   Smoking status: Former    Types: Cigars   Smokeless tobacco: Never   Tobacco comments:    Smoked for 2-3 months and around  December 2022.   Vaping Use   Vaping status: Never Used  Substance Use Topics   Alcohol use: Not Currently   Drug use: Never   No Known Allergies Medications Prior to Admission  Medication Sig Dispense Refill Last Dose/Taking   Blood Pressure Monitoring (BLOOD PRESSURE KIT) DEVI 1 Device by Does not apply route as needed. 1 each 0    Misc. Devices (GOJJI WEIGHT SCALE) MISC 1 Device by Does not apply route as needed. 1 each 0     I have reviewed patient's Past Medical Hx, Surgical Hx, Family Hx, Social Hx, medications and allergies.   ROS  Pertinent items noted in HPI and remainder of comprehensive ROS otherwise negative.   PHYSICAL EXAM  Patient Vitals for the past 24 hrs:  BP Temp Temp src Pulse Resp SpO2 Height Weight  11/15/23 1802 118/67 98.5 F (36.9 C) Oral 73 17 100 % -- --  11/15/23 1754 -- -- -- -- -- -- 5' 7 (1.702 m) (!) 148.2 kg    Physical Exam Constitutional:      General: She is not in acute distress.    Appearance: She is well-developed. She is obese. She is not ill-appearing.  HENT:     Head: Normocephalic.  Cardiovascular:     Rate and Rhythm: Normal rate.  Pulmonary:     Effort: Pulmonary effort is normal.  Abdominal:     Palpations: Abdomen is soft.     Tenderness: There is no abdominal tenderness.  Musculoskeletal:        General: Normal range of motion.     Cervical back: Normal range of motion.  Skin:    General: Skin is warm.  Neurological:     Mental Status: She is alert and oriented to person, place, and time.  Psychiatric:        Mood and Affect: Mood normal.        Behavior: Behavior normal.         Fetal Tracing via doppler 153    Labs: No results found for this or any previous visit (from the past 24 hours).  Imaging:  No results found.  MDM & MAU COURSE  MDM:  Moderate    Prenatal chart reviewed Physical exam performed  Lower abdominal pain resolved most likely due to MSK/Round ligament pain in pregnancy UA  pending at discharge Fetal HR appropriate at 153 Offered patient Flexeril  - Patient declined and requested discharge due to feeling better  Maternity Support belt recommended  Rx for Flexeril  sent at discharge at patient request  OB f/u as scheduled    MAU Course: Orders Placed This Encounter  Procedures   Urinalysis, Routine w reflex microscopic -Urine, Clean Catch   Discharge patient Discharge disposition: 01-Home or Self Care; Discharge patient date: 11/15/2023   Meds ordered this encounter  Medications   cyclobenzaprine  (FLEXERIL ) 10 MG tablet    Sig: Take 1 tablet (10 mg total) by mouth 2 (two) times daily as needed for muscle spasms.    Dispense:  20 tablet    Refill:  0    Supervising Provider:   PRATT, TANYA S [[id number]]    I have reviewed the patient chart and performed the physical exam .  A/P as described below.  Counseling and education provided and patient agreeable  with plan as described below. Verbalized understanding.    ASSESSMENT   1. Pain of round ligament affecting pregnancy, antepartum   2. [redacted] weeks gestation of pregnancy   3. Vaginal spotting      PLAN  Discharge home in stable condition with return precautions.   See AVS for full description of information given to the patient including both verbal and written. Patient verbalized understanding and agrees with the plan as described above.     Follow-up Information     [facility] for [facility] Follow up.   Specialty: Obstetrics and Gynecology Why: If symptoms worsen or fail to resolve, As scheduled for ongoing prenatal care Contact information: [address] [place]  [id number] [phone]                Allergies as of 11/15/2023   No Known Allergies      Medication List     TAKE these medications    Blood Pressure Kit Devi 1 Device by Does not apply route as needed.   cyclobenzaprine  10 MG tablet Commonly known as:  FLEXERIL  Take 1 tablet (10 mg total) by mouth 2 (two) times daily as needed for muscle spasms.   Gojji Weight Scale Misc 1 Device by Does not apply route as needed.        Olam Dalton, MSN, [facility], Center for [employer]

## 2023-11-15 NOTE — Discharge Instructions (Signed)

## 2023-11-15 NOTE — MAU Note (Signed)
 Catherine Cochran is a 28 y.o. at [redacted]w[redacted]d here in MAU reporting: she has been diagnosed with a subchorionic hemorrhage and has continued to had VB that was initially red but now is darker red and brown.  Questioning how long she's supposed to bleed.  Also c/o left lateral abdominal and lower back pain that began 20 minutes ago.  Reports left sided is intermittent, sharp and  aching and back is intermittent and aching.  LMP: 07/26/2023 Onset of complaint: today Pain score: 5 abdomen & back Vitals:   11/15/23 1802  BP: 118/67  Pulse: 73  Resp: 17  Temp: 98.5 F (36.9 C)  SpO2: 100%     FHT: 153 bpm  Lab orders placed from triage: UA

## 2023-11-20 ENCOUNTER — Other Ambulatory Visit (HOSPITAL_COMMUNITY)
Admission: RE | Admit: 2023-11-20 | Discharge: 2023-11-20 | Disposition: A | Discharge status: Home / Self Care | Source: Ambulatory Visit | Attending: Family Medicine | Admitting: Family Medicine

## 2023-11-20 ENCOUNTER — Other Ambulatory Visit: Payer: Self-pay

## 2023-11-20 ENCOUNTER — Ambulatory Visit (INDEPENDENT_AMBULATORY_CARE_PROVIDER_SITE_OTHER): Payer: Self-pay | Admitting: Family Medicine

## 2023-11-20 VITALS — BP 98/67 | HR 130 | Wt 323.8 lb

## 2023-11-20 DIAGNOSIS — Z98891 History of uterine scar from previous surgery: Secondary | ICD-10-CM

## 2023-11-20 DIAGNOSIS — Z3480 Encounter for supervision of other normal pregnancy, unspecified trimester: Secondary | ICD-10-CM | POA: Insufficient documentation

## 2023-11-20 DIAGNOSIS — F325 Major depressive disorder, single episode, in full remission: Secondary | ICD-10-CM

## 2023-11-20 DIAGNOSIS — Z3490 Encounter for supervision of normal pregnancy, unspecified, unspecified trimester: Secondary | ICD-10-CM

## 2023-11-20 DIAGNOSIS — N939 Abnormal uterine and vaginal bleeding, unspecified: Secondary | ICD-10-CM | POA: Diagnosis not present

## 2023-11-20 DIAGNOSIS — Z3A16 16 weeks gestation of pregnancy: Secondary | ICD-10-CM

## 2023-11-20 DIAGNOSIS — Z8759 Personal history of other complications of pregnancy, childbirth and the puerperium: Secondary | ICD-10-CM

## 2023-11-20 MED ORDER — ASPIRIN 81 MG PO TBEC: 162.0000 mg | DELAYED_RELEASE_TABLET | Freq: Every day | ORAL | 12 refills | Status: DC

## 2023-11-20 NOTE — Progress Notes (Unsigned)
 Subjective:   Catherine Cochran is a 28 y.o. G5P4004 at [redacted]w[redacted]d by 13-week ultrasound being seen today for her first obstetrical visit.  Her obstetrical history is significant for pregnancy induced hypertension. Patient does intend to breast feed. Pregnancy history fully reviewed.  Patient reports bleeding, no contractions, and no leaking.  HISTORY: OB History  Gravida Para Term Preterm AB Living  5 4 4  0 0 4  SAB IAB Ectopic Multiple Live Births  0 0 0 0 4    # Outcome Date GA Lbr Len/2nd Weight Sex Type Anes PTL Lv  5 Current           4 Term 08/25/18 [redacted]w[redacted]d  6 lb 6.3 oz (2.9 kg) F CS-LTranv Spinal  LIV     Name: [name],[name]     Apgar1: 9  Apgar5: 9  3 Term 05/24/17 [redacted]w[redacted]d  5 lb 11.4 oz (2.591 kg) F CS-LTranv EPI N LIV     Birth Comments: 2 vessel cord     Complications: Gestational hypertension     Name: [name],[name]     Apgar1: 8  Apgar5: 9  2 Term 02/28/16 [redacted]w[redacted]d  6 lb 7 oz (2.92 kg) M CS-LTranv EPI N LIV     Complications: Fetal Intolerance     Name: [name]  1 Term 10/29/14 [redacted]w[redacted]d  7 lb 9 oz (3.43 kg) F Vag-Spont EPI N LIV   Last pap smear was 11/27/2021 and was normal Past Medical History:  Diagnosis Date   Anemia    Anemia in pregnancy 06/02/2018   Anxiety    GBS (group B Streptococcus carrier), +RV culture, currently pregnant 08/18/2018   H/O: C-section 08/25/2018   History of 2 cesarean sections 03/21/2018   2017  Due to fetal intolerance of labor;  2018 Elective C/S due to bleeding during labor     History of gestational hypertension 03/21/2018   Hypertension    Gestational HTN 2018   IUD (intrauterine device) in place 08/25/2018   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 It  has not been reported as a cause of HDN. Titer not indicated.      Need for pneumococcal vaccination 07/24/2023   Obesity in pregnancy 04/03/2018   Single umbilical artery affecting management of mother  in singleton pregnancy, antepartum 02/19/2017   Formatting of this note might be different from the original. F/U US  12/04 Apr 2017: EFW 40%, follow up scheduled for Feb 18th Last Assessment & Plan: Formatting of this note might be different from the original. Most recent growth ultrasound 2/4: - EFW 1763g, 3#14, GP 28th% - repeat growth next week - patient receiving twice weekly testing for gestational hypertension diagnosis     Skin change 07/24/2023   Status post repeat low transverse cesarean section 08/25/2018   Supervision of high risk pregnancy, antepartum 03/21/2018              Nursing Staff    Provider      Office Location     [facility]    Dating     LMP certain       Language     English    Anatomy US      normal      Flu Vaccine     Declined    Genetic Screen     NIPS: Low risk   AFP: Negative  TDaP vaccine      06/19/18    Hgb A1C or   GTT       Third trimester: neg      Rhogam     n/a         LAB RESULTS       Feeding Plan    Breast    Blood Type    B/Po   Past Surgical History:  Procedure Laterality Date   CESAREAN SECTION  2017, 2018   CESAREAN SECTION N/A 08/25/2018   Procedure: CESAREAN SECTION;  Surgeon: Nicholaus Burnard HERO, MD;  Location: [facility] LD [facility]S;  Service: Obstetrics;  Laterality: N/A;   CESAREAN SECTION     TONSILLECTOMY     2nd grade   WISDOM TOOTH EXTRACTION     Family History  Problem Relation Age of Onset   Diabetes Mother    HIV Mother    Asthma Mother    Asthma Brother    Breast cancer Maternal Grandmother    Social History   Tobacco Use   Smoking status: Former    Types: Cigars   Smokeless tobacco: Never   Tobacco comments:    Smoked for 2-3 months and around December 2022.   Vaping Use   Vaping status: Never Used  Substance Use Topics   Alcohol use: Not Currently   Drug use: Never   No Known Allergies Current Outpatient Medications on File Prior to Visit  Medication Sig Dispense Refill   cyclobenzaprine  (FLEXERIL ) 10 MG tablet Take 1 tablet (10 mg  total) by mouth 2 (two) times daily as needed for muscle spasms. 20 tablet 0   Prenatal Vit-Fe Fumarate-FA (MULTIVITAMIN-PRENATAL) 27-0.8 MG TABS tablet Take 1 tablet by mouth daily at 12 noon.     Blood Pressure Monitoring (BLOOD PRESSURE KIT) DEVI 1 Device by Does not apply route as needed. 1 each 0   Misc. Devices (GOJJI WEIGHT SCALE) MISC 1 Device by Does not apply route as needed. 1 each 0   No current facility-administered medications on file prior to visit.     Exam   Vitals:   11/20/23 0927  BP: 98/67  Pulse: (!) 130  Weight: (!) 323 lb 12.8 oz (146.9 kg)      Uterus:     System: General: well-developed, well-nourished female in no acute distress   Skin: normal coloration and turgor, no rashes   Neurologic: oriented, normal, negative, normal mood   Extremities: normal strength, tone, and muscle mass, ROM of all joints is normal   HEENT PERRLA, extraocular movement intact and sclera clear, anicteric   Mouth/Teeth mucous membranes moist, pharynx normal without lesions and dental hygiene good   Neck supple and no masses   Cardiovascular: regular rate and rhythm   Respiratory:  no respiratory distress, normal breath sounds   Abdomen: soft, non-tender; bowel sounds normal; no masses,  no organomegaly     Assessment:   Pregnancy: [id number] Patient Active Problem List   Diagnosis Date Noted   Supervision of other normal pregnancy, antepartum 11/07/2023   Mixed hyperlipidemia 07/24/2023   Morbid obesity with BMI of 50.0-59.9, adult (HCC) 07/24/2023   MDD (recurrent major depressive disorder) in remission (HCC) 04/03/2022   Leukocytosis 06/21/2021   Body mass index (BMI) 50.0-59.9, adult (HCC) 06/02/2021   Major depression in remission (HCC) 05/24/2021   History of cesarean section 08/25/2018     Plan:  1. Supervision of other normal pregnancy, antepartum (Primary) FHR and BP appropriate today Patient was still occasional  spotting but reports that it is improving.  Has  been seen multiple times in the MAU and has known subchorionic hemorrhage. - Comp Met (CMET) - Protein / creatinine ratio, urine - TSH - CBC/D/Plt+RPR+Rh+ABO+RubIgG... - Culture, OB Urine - Hemoglobin A1c - GC/Chlamydia probe amp ([place])not at [facility] - AFP, Serum, Open Spina Bifida  2. History of cesarean section Will need to repeat cesarean section.  Prior C-sections x 3.  3. Major depression in remission (HCC) Currently doing well  4. Vaginal spotting Has been spotting for weeks.  Reports that it is improving.  Small subchorionic hemorrhage seen on ultrasound on 8/15  5. History of gestational hypertension BP well-controlled today.  Will check baseline CMP and PC ratio Recommended 162 mg ASA daily  6. Pregnancy - PANORAMA PRENATAL TEST  7. [redacted] weeks gestation of pregnancy    Initial labs drawn. Continue prenatal vitamins. Genetic Screening discussed, NIPS: ordered. Ultrasound discussed; fetal anatomic survey: ordered. Problem list reviewed and updated. The nature of [facility] with multiple MDs and other Advanced Practice Providers was explained to patient; also emphasized that residents, students are part of our team. Routine obstetric precautions reviewed. No follow-ups on file.

## 2023-11-21 LAB — GC/CHLAMYDIA PROBE AMP (~~LOC~~) NOT AT ARMC
Chlamydia: NEGATIVE
Comment: NEGATIVE
Comment: NORMAL
Neisseria Gonorrhea: NEGATIVE

## 2023-11-22 LAB — AFP, SERUM, OPEN SPINA BIFIDA
AFP MoM: 1.41
AFP Value: 33.3 ng/mL
Gest. Age on Collection Date: 16 wk
Maternal Age At EDD: 29.4 a
OSBR Risk 1 IN: 7002
Test Results:: NEGATIVE
Weight: 323 [lb_av]

## 2023-11-22 LAB — CBC/D/PLT+RPR+RH+ABO+RUBIGG...
Basophils Absolute: 0.1 x10E3/uL (ref 0.0–0.2)
Basos: 1 %
EOS (ABSOLUTE): 0.2 x10E3/uL (ref 0.0–0.4)
Eos: 2 %
HCV Ab: NONREACTIVE
HIV Screen 4th Generation wRfx: NONREACTIVE
Hematocrit: 37.1 % (ref 34.0–46.6)
Hemoglobin: 11.9 g/dL (ref 11.1–15.9)
Hepatitis B Surface Ag: NEGATIVE
Immature Grans (Abs): 0.1 x10E3/uL (ref 0.0–0.1)
Immature Granulocytes: 1 %
Lymphocytes Absolute: 3 x10E3/uL (ref 0.7–3.1)
Lymphs: 21 %
MCH: 29.5 pg (ref 26.6–33.0)
MCHC: 32.1 g/dL (ref 31.5–35.7)
MCV: 92 fL (ref 79–97)
Monocytes Absolute: 0.6 x10E3/uL (ref 0.1–0.9)
Monocytes: 4 %
Neutrophils Absolute: 10.2 x10E3/uL — ABNORMAL HIGH (ref 1.4–7.0)
Neutrophils: 71 %
Platelets: 298 x10E3/uL (ref 150–450)
RBC: 4.04 x10E6/uL (ref 3.77–5.28)
RDW: 14.1 % (ref 11.7–15.4)
RPR Ser Ql: NONREACTIVE
Rh Factor: POSITIVE
Rubella Antibodies, IGG: 7.39 {index} (ref >0.99)
WBC: 14.1 x10E3/uL — ABNORMAL HIGH (ref 3.4–10.8)

## 2023-11-22 LAB — COMPREHENSIVE METABOLIC PANEL WITH GFR
ALT: 6 IU/L (ref 0–32)
AST: 10 IU/L (ref 0–40)
Albumin: 3.6 g/dL — ABNORMAL LOW (ref 4.0–5.0)
Alkaline Phosphatase: 74 IU/L (ref 44–121)
BUN/Creatinine Ratio: 10 (ref 9–23)
BUN: 6 mg/dL (ref 6–20)
Bilirubin Total: <0.2 mg/dL (ref 0.0–1.2)
CO2: 20 mmol/L (ref 20–29)
Calcium: 9.5 mg/dL (ref 8.7–10.2)
Chloride: 102 mmol/L (ref 96–106)
Creatinine, Ser: 0.58 mg/dL (ref 0.57–1.00)
Globulin, Total: 2.7 g/dL (ref 1.5–4.5)
Glucose: 86 mg/dL (ref 70–99)
Potassium: 4 mmol/L (ref 3.5–5.2)
Sodium: 137 mmol/L (ref 134–144)
Total Protein: 6.3 g/dL (ref 6.0–8.5)
eGFR: 126 mL/min/1.73 (ref >59)

## 2023-11-22 LAB — AB SCR+ANTIBODY ID: Antibody Screen: POSITIVE — AB

## 2023-11-22 LAB — TSH: TSH: 2.21 u[IU]/mL (ref 0.450–4.500)

## 2023-11-22 LAB — HCV INTERPRETATION

## 2023-11-22 LAB — HEMOGLOBIN A1C
Est. average glucose Bld gHb Est-mCnc: 114 mg/dL
Hgb A1c MFr Bld: 5.6 % (ref 4.8–5.6)

## 2023-11-22 LAB — URINE CULTURE, OB REFLEX

## 2023-11-22 LAB — CULTURE, OB URINE

## 2023-11-25 LAB — PANORAMA PRENATAL TEST FULL PANEL:PANORAMA TEST PLUS 5 ADDITIONAL MICRODELETIONS: FETAL FRACTION: 4.2

## 2023-11-27 ENCOUNTER — Inpatient Hospital Stay (HOSPITAL_COMMUNITY)
Admission: AD | Admit: 2023-11-27 | Discharge: 2023-11-27 | Disposition: A | Discharge status: Home / Self Care | Attending: Obstetrics and Gynecology | Admitting: Obstetrics and Gynecology

## 2023-11-27 DIAGNOSIS — O208 Other hemorrhage in early pregnancy: Secondary | ICD-10-CM | POA: Insufficient documentation

## 2023-11-27 DIAGNOSIS — O4692 Antepartum hemorrhage, unspecified, second trimester: Secondary | ICD-10-CM

## 2023-11-27 DIAGNOSIS — Z3480 Encounter for supervision of other normal pregnancy, unspecified trimester: Secondary | ICD-10-CM

## 2023-11-27 DIAGNOSIS — Z98891 History of uterine scar from previous surgery: Secondary | ICD-10-CM

## 2023-11-27 DIAGNOSIS — Z3A17 17 weeks gestation of pregnancy: Secondary | ICD-10-CM

## 2023-11-27 LAB — URINALYSIS, ROUTINE W REFLEX MICROSCOPIC
Bilirubin Urine: NEGATIVE
Glucose, UA: NEGATIVE mg/dL
Ketones, ur: NEGATIVE mg/dL
Nitrite: NEGATIVE
Protein, ur: 100 mg/dL — AB
RBC / HPF: >50 RBC/hpf (ref 0–5)
Specific Gravity, Urine: 1.004 — ABNORMAL LOW (ref 1.005–1.030)
pH: 7 (ref 5.0–8.0)

## 2023-11-27 LAB — WET PREP, GENITAL
Clue Cells Wet Prep HPF POC: NONE SEEN
Sperm: NONE SEEN
Trich, Wet Prep: NONE SEEN
WBC, Wet Prep HPF POC: <10 (ref <10)
Yeast Wet Prep HPF POC: NONE SEEN

## 2023-11-27 NOTE — MAU Note (Signed)
 Catherine Cochran is a 28 y.o. at [redacted]w[redacted]d here in MAU reporting vag bleeding for weeks. Has known subchorionic hemorrhage and has bleeding off and on. Bleeding has been dark red but today was brighter. Went to [employer] [facility] and saw a small blood clot and was concerned  LMP: na Onset of complaint: this evening Pain score: 6 Vitals:   11/27/23 0107 11/27/23 0110  BP:  127/83  Pulse: (!) 109   Resp: 19   Temp: 97.9 F (36.6 C)   SpO2: 100%      FHT: unable to hear in Triage  Lab orders placed from triage: u/a

## 2023-11-27 NOTE — Progress Notes (Signed)
 Written and verbal d/c instructions given and pt voiced understanding.

## 2023-11-27 NOTE — MAU Note (Signed)
 Dr Lola in Triage to see pt and do bedside u/s

## 2023-11-27 NOTE — MAU Provider Note (Signed)
 History     [id number]  Arrival date and time: 11/27/23 0100    Chief Complaint  Patient presents with   Vaginal Bleeding     HPI Catherine Cochran is a 28 y.o. at [redacted]w[redacted]d by ultrasound with PMHx notable for cesarean x3, known subchorionic hematoma, who presents for vaginal bleeding.   On review of chart has been seen multiple times for [facility] bleeding Had new OB visit last week, normal GC/CT swab at that time  Today reports she had a clot come out about an hour ago Some on and off cramping but nothing major No bleeding since No other complaints   B/Positive/-- (09/03 1038)  OB History     Gravida  5   Para  4   Term  4   Preterm      AB      Living  4      SAB      IAB      Ectopic      Multiple      Live Births  4           Past Medical History:  Diagnosis Date   Anemia    Anemia in pregnancy 06/02/2018   Anxiety    GBS (group B Streptococcus carrier), +RV culture, currently pregnant 08/18/2018   H/O: C-section 08/25/2018   History of 2 cesarean sections 03/21/2018   2017  Due to fetal intolerance of labor;  2018 Elective C/S due to bleeding during labor     History of gestational hypertension 03/21/2018   Hypertension    Gestational HTN 2018   IUD (intrauterine device) in place 08/25/2018   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 It  has not been reported as a cause of HDN. Titer not indicated.      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 F/U US  12/04 Apr 2017: EFW 40%, follow up scheduled for Feb 18th Last Assessment & Plan: Formatting of this note might be different from the original. Most recent growth ultrasound 2/4: - EFW 1763g, 3#14, GP 28th% - repeat growth next  week - patient receiving twice weekly testing for gestational hypertension diagnosis     Skin change 07/24/2023   Status post repeat low transverse cesarean section 08/25/2018   Supervision of high risk pregnancy, antepartum 03/21/2018              Nursing Staff    Provider      Office Location     [facility]    Dating     LMP certain       Language     English    Anatomy US      normal      Flu Vaccine     Declined    Genetic Screen     NIPS: Low risk   AFP: Negative         TDaP vaccine      06/19/18    Hgb A1C or   GTT       Third trimester: neg      Rhogam     n/a         LAB RESULTS       Feeding Plan    Breast  Blood Type    B/Po    Past Surgical History:  Procedure Laterality Date   CESAREAN SECTION  2017, 2018   CESAREAN SECTION N/A 08/25/2018   Procedure: CESAREAN SECTION;  Surgeon: Nicholaus Burnard HERO, MD;  Location: [facility] LD [facility]S;  Service: Obstetrics;  Laterality: N/A;   CESAREAN SECTION     TONSILLECTOMY     2nd grade   WISDOM TOOTH EXTRACTION      Family History  Problem Relation Age of Onset   Diabetes Mother    HIV Mother    Asthma Mother    Asthma Brother    Breast cancer Maternal Grandmother     Social History   Socioeconomic History   Marital status: Single    Spouse name: Not on file   Number of children: 2   Years of education: 12   Highest education level: GED or equivalent  Occupational History   Occupation: Lobbyist: [employer]  Tobacco Use   Smoking status: Former    Types: Cigars   Smokeless tobacco: Never   Tobacco comments:    Smoked for 2-3 months and around December 2022.   Vaping Use   Vaping status: Never Used  Substance and Sexual Activity   Alcohol use: Not Currently   Drug use: Never   Sexual activity: Yes    Partners: Male    Birth control/protection: Condom    Comment: First IC @ 28y/o, Hx of CT+  Other Topics Concern   Not on file  Social History Narrative   Not on file   Social Drivers of Health   Financial Resource  Strain: Low Risk  (07/20/2023)   Overall Financial Resource Strain (CARDIA)    Difficulty of Paying Living Expenses: Not hard at all  Food Insecurity: No Food Insecurity (11/20/2023)   Hunger Vital Sign    Worried About Running Out of Food in the Last Year: Never true    Ran Out of Food in the Last Year: Never true  Transportation Needs: No Transportation Needs (11/20/2023)   PRAPARE - Administrator, Civil Service (Medical): No    Lack of Transportation (Non-Medical): No  Physical Activity: Insufficiently Active (07/20/2023)   Exercise Vital Sign    Days of Exercise per Week: 3 days    Minutes of Exercise per Session: 30 min  Stress: No Stress Concern Present (07/20/2023)   [employer] of Occupational Health - Occupational Stress Questionnaire    Feeling of Stress : Only a little  Social Connections: Moderately Isolated (07/20/2023)   Social Connection and Isolation Panel    Frequency of Communication with Friends and Family: More than three times a week    Frequency of Social Gatherings with Friends and Family: Once a week    Attends Religious Services: Never    Database administrator or Organizations: No    Attends Engineer, structural: Not on file    Marital Status: Living with partner  Intimate Partner Violence: Unknown (06/06/2019)   Received from [facility]   Intimate Partner Violence    Fear of Current or Ex-Partner: Not asked    Emotionally Abused: Not asked    Physically Abused: Not asked    Sexually Abused: Not asked    No Known Allergies  No current facility-administered medications on file prior to encounter.   Current Outpatient Medications on File Prior to Encounter  Medication Sig Dispense Refill   aspirin  EC 81 MG tablet Take 2 tablets (162  mg total) by mouth daily. Swallow whole. 60 tablet 12   Blood Pressure Monitoring (BLOOD PRESSURE KIT) DEVI 1 Device by Does not apply route as needed. 1 each 0   cyclobenzaprine  (FLEXERIL ) 10 MG tablet  Take 1 tablet (10 mg total) by mouth 2 (two) times daily as needed for muscle spasms. 20 tablet 0   Misc. Devices (GOJJI WEIGHT SCALE) MISC 1 Device by Does not apply route as needed. 1 each 0   Prenatal Vit-Fe Fumarate-FA (MULTIVITAMIN-PRENATAL) 27-0.8 MG TABS tablet Take 1 tablet by mouth daily at 12 noon.       ROS Pertinent positives and negative per HPI, all others reviewed and negative  Physical Exam   BP 127/83   Pulse (!) 109   Temp 97.9 F (36.6 C)   Resp 19   Ht 5' 7 (1.702 m)   Wt (!) 146.5 kg   LMP 07/26/2023   SpO2 100%   BMI 50.59 kg/m   Patient Vitals for the past 24 hrs:  BP Temp Pulse Resp SpO2 Height Weight  11/27/23 0110 127/83 -- -- -- -- -- --  11/27/23 0107 -- 97.9 F (36.6 C) (!) 109 19 100 % 5' 7 (1.702 m) (!) 146.5 kg    Physical Exam Vitals reviewed.  Constitutional:      General: She is not in acute distress.    Appearance: She is well-developed. She is not diaphoretic.  Eyes:     General: No scleral icterus. Pulmonary:     Effort: Pulmonary effort is normal. No respiratory distress.  Abdominal:     General: There is no distension.     Palpations: Abdomen is soft.     Tenderness: There is no abdominal tenderness. There is no guarding or rebound.  Skin:    General: Skin is warm and dry.  Neurological:     Mental Status: She is alert.     Coordination: Coordination normal.      Cervical Exam    Bedside Ultrasound Pt informed that the ultrasound is considered a limited OB ultrasound and is not intended to be a complete ultrasound exam.  Patient also informed that the ultrasound is not being completed with the intent of assessing for fetal or placental anomalies or any pelvic abnormalities.  Explained that the purpose of today's ultrasound is to assess for  viability.  Patient acknowledges the purpose of the exam and the limitations of the study.      My interpretation: Second/third trimester findings: normal FHR 160 bpm by M  mode   Labs No results found for this or any previous visit (from the past 24 hours).  Imaging No results found.  MAU Course  Procedures Lab Orders         Wet prep, genital         Urinalysis, Routine w reflex microscopic -Urine, Clean Catch    No orders of the defined types were placed in this encounter.  Imaging Orders  No imaging studies ordered today    MDM Moderate (Level 3-4)  Assessment and Plan  #Vaginal bleeding in pregnancy, second trimester #Known subchorionic hematoma #[redacted] weeks gestation of pregnancy US  shows viable IUP with normal FHR. Recent normal GC/CT swab but will get wet prep to ule out other infectious etiologies. We discussed that ongoing bleeding with [facility] is common but unfortunately there are no known interventions. B/Positive/-- (09/03 1038), rhogam not indicated. We discussed return precautions including crescendo abdominal pain, heavy vaginal bleeding soaking >1 pad/hour, and fever.  If anxious also discussed can call clinic to schedule nurse visit to check on heart tones.    #FWB Normal FHR by bedside US , 160 bpm   Dispo: discharged to home in stable condition    Donnice CHRISTELLA Carolus, MD/MPH 11/27/23 2:00 AM  Allergies as of 11/27/2023   No Known Allergies      Medication List     TAKE these medications    aspirin  EC 81 MG tablet Take 2 tablets (162 mg total) by mouth daily. Swallow whole.   Blood Pressure Kit Devi 1 Device by Does not apply route as needed.   cyclobenzaprine  10 MG tablet Commonly known as: FLEXERIL  Take 1 tablet (10 mg total) by mouth 2 (two) times daily as needed for muscle spasms.   Gojji Weight Scale Misc 1 Device by Does not apply route as needed.   multivitamin-prenatal 27-0.8 MG Tabs tablet Take 1 tablet by mouth daily at 12 noon.

## 2023-11-27 NOTE — Discharge Instructions (Signed)
 You were seen in the MAU for vaginal bleeding. We did an ultrasound that showed the baby has a healthy heart rate. We discussed that bleeding may continue unpredictably but that there are no known treatments for this at this stage of pregnancy.  We discussed that vaginal bleeding in the first trimester is common, and that most patients will go on to have a normal pregnancy with a live delivery. The remainder are at increased risk for miscarriage, unfortunately there are no known interventions to mitigate this risk. We discussed return precautions including crescendo abdominal pain, heavy vaginal bleeding soaking >1 pad/hour, and fever.

## 2023-11-29 DIAGNOSIS — Z419 Encounter for procedure for purposes other than remedying health state, unspecified: Secondary | ICD-10-CM | POA: Diagnosis not present

## 2023-12-13 ENCOUNTER — Inpatient Hospital Stay (HOSPITAL_COMMUNITY)
Admission: AD | Admit: 2023-12-13 | Discharge: 2023-12-14 | DRG: 770 | Disposition: A | Discharge status: Home / Self Care | Attending: Obstetrics & Gynecology | Admitting: Obstetrics & Gynecology

## 2023-12-13 ENCOUNTER — Inpatient Hospital Stay (HOSPITAL_COMMUNITY): Admitting: Anesthesiology

## 2023-12-13 ENCOUNTER — Encounter (HOSPITAL_COMMUNITY): Payer: Self-pay | Admitting: Anesthesiology

## 2023-12-13 ENCOUNTER — Inpatient Hospital Stay (HOSPITAL_COMMUNITY)

## 2023-12-13 ENCOUNTER — Encounter (HOSPITAL_COMMUNITY): Payer: Self-pay | Admitting: Obstetrics & Gynecology

## 2023-12-13 ENCOUNTER — Encounter (HOSPITAL_COMMUNITY)
Admission: AD | Disposition: A | Discharge status: Home / Self Care | Payer: Self-pay | Source: Home / Self Care | Attending: Obstetrics & Gynecology

## 2023-12-13 ENCOUNTER — Other Ambulatory Visit: Payer: Self-pay

## 2023-12-13 DIAGNOSIS — R000 Tachycardia, unspecified: Secondary | ICD-10-CM | POA: Diagnosis not present

## 2023-12-13 DIAGNOSIS — Z3A19 19 weeks gestation of pregnancy: Secondary | ICD-10-CM

## 2023-12-13 DIAGNOSIS — D62 Acute posthemorrhagic anemia: Secondary | ICD-10-CM | POA: Diagnosis not present

## 2023-12-13 DIAGNOSIS — Z98891 History of uterine scar from previous surgery: Principal | ICD-10-CM

## 2023-12-13 DIAGNOSIS — E782 Mixed hyperlipidemia: Secondary | ICD-10-CM | POA: Diagnosis not present

## 2023-12-13 DIAGNOSIS — O039 Complete or unspecified spontaneous abortion without complication: Secondary | ICD-10-CM | POA: Diagnosis present

## 2023-12-13 DIAGNOSIS — N939 Abnormal uterine and vaginal bleeding, unspecified: Secondary | ICD-10-CM

## 2023-12-13 DIAGNOSIS — O021 Missed abortion: Principal | ICD-10-CM | POA: Diagnosis present

## 2023-12-13 DIAGNOSIS — E66813 Obesity, class 3: Secondary | ICD-10-CM | POA: Diagnosis not present

## 2023-12-13 DIAGNOSIS — Z7982 Long term (current) use of aspirin: Secondary | ICD-10-CM

## 2023-12-13 DIAGNOSIS — Z87891 Personal history of nicotine dependence: Secondary | ICD-10-CM

## 2023-12-13 DIAGNOSIS — Z6841 Body Mass Index (BMI) 40.0 and over, adult: Secondary | ICD-10-CM

## 2023-12-13 DIAGNOSIS — I959 Hypotension, unspecified: Secondary | ICD-10-CM | POA: Diagnosis not present

## 2023-12-13 DIAGNOSIS — O034 Incomplete spontaneous abortion without complication: Secondary | ICD-10-CM

## 2023-12-13 DIAGNOSIS — O4592 Premature separation of placenta, unspecified, second trimester: Secondary | ICD-10-CM

## 2023-12-13 DIAGNOSIS — Z3480 Encounter for supervision of other normal pregnancy, unspecified trimester: Secondary | ICD-10-CM

## 2023-12-13 DIAGNOSIS — O364XX0 Maternal care for intrauterine death, not applicable or unspecified: Secondary | ICD-10-CM | POA: Diagnosis not present

## 2023-12-13 DIAGNOSIS — I10 Essential (primary) hypertension: Secondary | ICD-10-CM | POA: Diagnosis not present

## 2023-12-13 LAB — WET PREP, GENITAL
Clue Cells Wet Prep HPF POC: NONE SEEN
Sperm: NONE SEEN
Trich, Wet Prep: NONE SEEN
WBC, Wet Prep HPF POC: >=10 — AB (ref <10)
Yeast Wet Prep HPF POC: NONE SEEN

## 2023-12-13 LAB — CBC
HCT: 34.1 % — ABNORMAL LOW (ref 36.0–46.0)
HCT: 22.8 % — ABNORMAL LOW (ref 36.0–46.0)
Hemoglobin: 11.1 g/dL — ABNORMAL LOW (ref 12.0–15.0)
Hemoglobin: 7.4 g/dL — ABNORMAL LOW (ref 12.0–15.0)
MCH: 28.7 pg (ref 26.0–34.0)
MCH: 28.9 pg (ref 26.0–34.0)
MCHC: 32.6 g/dL (ref 30.0–36.0)
MCHC: 32.5 g/dL (ref 30.0–36.0)
MCV: 88.1 fL (ref 80.0–100.0)
MCV: 89.1 fL (ref 80.0–100.0)
Platelets: 357 K/uL (ref 150–400)
Platelets: 340 K/uL (ref 150–400)
RBC: 3.87 MIL/uL (ref 3.87–5.11)
RBC: 2.56 MIL/uL — ABNORMAL LOW (ref 3.87–5.11)
RDW: 14.4 % (ref 11.5–15.5)
RDW: 14.4 % (ref 11.5–15.5)
WBC: 13.8 K/uL — ABNORMAL HIGH (ref 4.0–10.5)
WBC: 17.5 K/uL — ABNORMAL HIGH (ref 4.0–10.5)
nRBC: 0 % (ref 0.0–0.2)
nRBC: 0 % (ref 0.0–0.2)

## 2023-12-13 LAB — PREPARE RBC (CROSSMATCH)

## 2023-12-13 SURGERY — Surgical Case
Anesthesia: Spinal

## 2023-12-13 MED ORDER — ACETAMINOPHEN 325 MG PO TABS: 650.0000 mg | ORAL_TABLET | ORAL | Status: DC | PRN

## 2023-12-13 MED ORDER — OXYCODONE-ACETAMINOPHEN 5-325 MG PO TABS: 1.0000 | ORAL_TABLET | ORAL | Status: DC | PRN

## 2023-12-13 MED ORDER — PHENYLEPHRINE 80 MCG/ML (10ML) SYRINGE FOR IV PUSH (FOR BLOOD PRESSURE SUPPORT): 80.0000 ug | PREFILLED_SYRINGE | INTRAVENOUS | Status: DC | PRN

## 2023-12-13 MED ORDER — ONDANSETRON HCL 4 MG/2ML IJ SOLN
INTRAMUSCULAR | Status: DC | PRN
  Administered 2023-12-13: 4 mg via INTRAVENOUS

## 2023-12-13 MED ORDER — LACTATED RINGERS IV SOLN: INTRAVENOUS | Status: DC

## 2023-12-13 MED ORDER — PHENYLEPHRINE 80 MCG/ML (10ML) SYRINGE FOR IV PUSH (FOR BLOOD PRESSURE SUPPORT)
PREFILLED_SYRINGE | INTRAVENOUS | Status: AC
  Filled 2023-12-13: qty 30

## 2023-12-13 MED ORDER — OXYTOCIN-SODIUM CHLORIDE 30-0.9 UT/500ML-% IV SOLN: 1.0000 m[IU]/min | INTRAVENOUS | Status: DC

## 2023-12-13 MED ORDER — OXYTOCIN-SODIUM CHLORIDE 30-0.9 UT/500ML-% IV SOLN
2.5000 [IU]/h | INTRAVENOUS | Status: DC
  Filled 2023-12-13: qty 500

## 2023-12-13 MED ORDER — LACTATED RINGERS IV SOLN
500.0000 mL | INTRAVENOUS | Status: DC | PRN
  Administered 2023-12-13: 500 mL via INTRAVENOUS
  Administered 2023-12-13: 1000 mL via INTRAVENOUS
  Administered 2023-12-13: 500 mL via INTRAVENOUS

## 2023-12-13 MED ORDER — OXYCODONE-ACETAMINOPHEN 5-325 MG PO TABS: 2.0000 | ORAL_TABLET | ORAL | Status: DC | PRN

## 2023-12-13 MED ORDER — HYDROXYZINE HCL 50 MG PO TABS: 50.0000 mg | ORAL_TABLET | Freq: Four times a day (QID) | ORAL | Status: DC | PRN

## 2023-12-13 MED ORDER — PHENYLEPHRINE HCL-NACL 20-0.9 MG/250ML-% IV SOLN
INTRAVENOUS | Status: DC | PRN
  Administered 2023-12-13: 60 ug/min via INTRAVENOUS

## 2023-12-13 MED ORDER — MISOPROSTOL 200 MCG PO TABS: 600.0000 ug | ORAL_TABLET | ORAL | Status: DC | PRN

## 2023-12-13 MED ORDER — FENTANYL CITRATE (PF) 100 MCG/2ML IJ SOLN
INTRAMUSCULAR | Status: AC
  Filled 2023-12-13: qty 2

## 2023-12-13 MED ORDER — SODIUM CHLORIDE 0.9% FLUSH: 3.0000 mL | Freq: Two times a day (BID) | INTRAVENOUS | Status: DC

## 2023-12-13 MED ORDER — DOXYCYCLINE HYCLATE 100 MG IV SOLR
200.0000 mg | Freq: Once | INTRAVENOUS | Status: DC
  Filled 2023-12-13: qty 200

## 2023-12-13 MED ORDER — EPHEDRINE 5 MG/ML INJ: 10.0000 mg | INTRAVENOUS | Status: DC | PRN

## 2023-12-13 MED ORDER — MISOPROSTOL 200 MCG PO TABS
400.0000 ug | ORAL_TABLET | ORAL | Status: DC
  Administered 2023-12-13: 400 ug via VAGINAL

## 2023-12-13 MED ORDER — LIDOCAINE HCL (PF) 1 % IJ SOLN: 30.0000 mL | INTRAMUSCULAR | Status: DC | PRN

## 2023-12-13 MED ORDER — PHENYLEPHRINE 80 MCG/ML (10ML) SYRINGE FOR IV PUSH (FOR BLOOD PRESSURE SUPPORT)
80.0000 ug | PREFILLED_SYRINGE | INTRAVENOUS | Status: DC | PRN
  Administered 2023-12-13: 80 ug via INTRAVENOUS

## 2023-12-13 MED ORDER — FENTANYL CITRATE (PF) 100 MCG/2ML IJ SOLN: 100.0000 ug | Freq: Once | INTRAMUSCULAR | Status: AC

## 2023-12-13 MED ORDER — FENTANYL-BUPIVACAINE-NACL 0.5-0.125-0.9 MG/250ML-% EP SOLN: 12.0000 mL/h | EPIDURAL | Status: DC | PRN

## 2023-12-13 MED ORDER — MORPHINE SULFATE (PF) 0.5 MG/ML IJ SOLN
INTRAMUSCULAR | Status: AC
  Filled 2023-12-13: qty 10

## 2023-12-13 MED ORDER — ALBUMIN HUMAN 5 % IV SOLN
12.5000 g | Freq: Once | INTRAVENOUS | Status: AC
  Administered 2023-12-13: 12.5 g via INTRAVENOUS

## 2023-12-13 MED ORDER — SODIUM CHLORIDE 0.9 % IV SOLN: 250.0000 mL | INTRAVENOUS | Status: DC | PRN

## 2023-12-13 MED ORDER — TRANEXAMIC ACID-NACL 1000-0.7 MG/100ML-% IV SOLN
1000.0000 mg | INTRAVENOUS | Status: DC
Start: 2023-12-13 — End: 2023-12-14

## 2023-12-13 MED ORDER — FENTANYL-BUPIVACAINE-NACL 0.5-0.125-0.9 MG/250ML-% EP SOLN
EPIDURAL | Status: AC
  Filled 2023-12-13: qty 250

## 2023-12-13 MED ORDER — MISOPROSTOL 200 MCG PO TABS
ORAL_TABLET | ORAL | Status: AC
  Filled 2023-12-13: qty 2

## 2023-12-13 MED ORDER — OXYTOCIN BOLUS FROM INFUSION: 333.0000 mL | Freq: Once | INTRAVENOUS | Status: DC

## 2023-12-13 MED ORDER — LACTATED RINGERS IV SOLN
500.0000 mL | Freq: Once | INTRAVENOUS | Status: DC
Start: 2023-12-13 — End: 2023-12-14

## 2023-12-13 MED ORDER — TRANEXAMIC ACID-NACL 1000-0.7 MG/100ML-% IV SOLN: 1000.0000 mg | INTRAVENOUS | Status: DC

## 2023-12-13 MED ORDER — MIDAZOLAM HCL 2 MG/2ML IJ SOLN
INTRAMUSCULAR | Status: DC | PRN
  Administered 2023-12-13 (×2): .5 mg via INTRAVENOUS
  Administered 2023-12-13: 1 mg via INTRAVENOUS

## 2023-12-13 MED ORDER — FENTANYL CITRATE (PF) 100 MCG/2ML IJ SOLN
INTRAMUSCULAR | Status: AC
  Administered 2023-12-13: 100 ug via INTRAVENOUS
  Filled 2023-12-13: qty 2

## 2023-12-13 MED ORDER — MIDAZOLAM HCL 2 MG/2ML IJ SOLN
INTRAMUSCULAR | Status: AC
  Filled 2023-12-13: qty 2

## 2023-12-13 MED ORDER — SODIUM CHLORIDE 0.9% FLUSH: 3.0000 mL | INTRAVENOUS | Status: DC | PRN

## 2023-12-13 MED ORDER — SOD CITRATE-CITRIC ACID 500-334 MG/5ML PO SOLN
30.0000 mL | ORAL | Status: DC | PRN
  Administered 2023-12-13: 30 mL via ORAL
  Filled 2023-12-13: qty 30

## 2023-12-13 MED ORDER — METOCLOPRAMIDE HCL 5 MG/ML IJ SOLN
INTRAMUSCULAR | Status: DC | PRN
  Administered 2023-12-13: 10 mg via INTRAVENOUS

## 2023-12-13 MED ORDER — SODIUM CHLORIDE 0.9% IV SOLUTION
Freq: Once | INTRAVENOUS | Status: DC
Start: 2023-12-14 — End: 2023-12-14

## 2023-12-13 MED ORDER — ONDANSETRON HCL 4 MG/2ML IJ SOLN: 4.0000 mg | Freq: Four times a day (QID) | INTRAMUSCULAR | Status: DC | PRN

## 2023-12-13 MED ORDER — FENTANYL CITRATE (PF) 100 MCG/2ML IJ SOLN
50.0000 ug | INTRAMUSCULAR | Status: DC | PRN
  Administered 2023-12-13 (×4): 50 ug via INTRAVENOUS
  Filled 2023-12-13: qty 2

## 2023-12-13 MED ORDER — DIPHENHYDRAMINE HCL 50 MG/ML IJ SOLN: 12.5000 mg | INTRAMUSCULAR | Status: DC | PRN

## 2023-12-13 SURGICAL SUPPLY — 1 items: MAT PREVALON FULL STRYKER (MISCELLANEOUS) IMPLANT

## 2023-12-13 NOTE — Anesthesia Preprocedure Evaluation (Signed)
 Anesthesia Evaluation  Patient identified by MRN, date of birth, ID band Patient awake    Reviewed: Allergy & Precautions, H&P , NPO status , Patient's Chart, lab work & pertinent test results  Airway Mallampati: II  TM Distance: >3 FB Neck ROM: Full    Dental no notable dental hx.    Pulmonary neg pulmonary ROS, former smoker   Pulmonary exam normal breath sounds clear to auscultation       Cardiovascular hypertension, negative cardio ROS Normal cardiovascular exam Rhythm:Regular Rate:Normal     Neuro/Psych negative neurological ROS  negative psych ROS   GI/Hepatic negative GI ROS, Neg liver ROS,,,  Endo/Other  negative endocrine ROS    Renal/GU negative Renal ROS  negative genitourinary   Musculoskeletal negative musculoskeletal ROS (+)    Abdominal  (+) + obese  Peds negative pediatric ROS (+)  Hematology negative hematology ROS (+)   Anesthesia Other Findings   Reproductive/Obstetrics negative OB ROS                              Anesthesia Physical Anesthesia Plan  ASA: 3  Anesthesia Plan:    Post-op Pain Management:    Induction:   PONV Risk Score and Plan:   Airway Management Planned:   Additional Equipment:   Intra-op Plan:   Post-operative Plan:   Informed Consent:   Plan Discussed with:   Anesthesia Plan Comments:         Anesthesia Quick Evaluation

## 2023-12-13 NOTE — MAU Note (Signed)
 Catherine Cochran is a 28 y.o. at [redacted]w[redacted]d here in MAU reporting: since 2pm has been having pain since 2 PM coming about q 10 min. Feels like I need to have a BM but nothing comes out.  Took flexeril  without relief.  Like ctx. Stated vag bleeding Is increased like a period now.   LMP:  Onset of complaint: 2pm  Pain score: 10 Vitals:   12/13/23 1713  BP: 134/82  Pulse: (!) 138  Resp: 18  Temp: 98.5 F (36.9 C)     FHT: will defer until in room  Lab orders placed from triage: u/a

## 2023-12-13 NOTE — H&P (Addendum)
 OBSTETRIC ADMISSION HISTORY AND PHYSICAL  Catherine Cochran is a 28 y.o. female [phone] with IUP at [redacted]w[redacted]d by 13 week US  presenting for miscarriage diagnosed in MAU today. She reports +FMs, No LOF, no VB, no blurry vision, headaches or peripheral edema, and RUQ pain.  She received her prenatal care at [facility]   Dating: By 13 week US  --->  Estimated Date of Delivery: 05/06/24  Sono:   No anatomy US  completed due to gestational age.   @[redacted]w[redacted]d , CWD, FHT present 149   Prenatal History/Complications: [facility] - Known SCH - Anti-Lewis Antibody  Past Medical History: Past Medical History:  Diagnosis Date   Anemia    Anemia in pregnancy 06/02/2018   Anxiety    GBS (group B Streptococcus carrier), +RV culture, currently pregnant 08/18/2018   H/O: C-section 08/25/2018   History of 2 cesarean sections 03/21/2018   2017  Due to fetal intolerance of labor;  2018 Elective C/S due to bleeding during labor     History of gestational hypertension 03/21/2018   Hypertension    Gestational HTN 2018   IUD (intrauterine device) in place 08/25/2018   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 It  has not been reported as a cause of HDN. Titer not indicated.      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 F/U US  12/04 Apr 2017: EFW 40%, follow up scheduled for Feb 18th Last Assessment & Plan: Formatting of this note might be different from the original. Most recent growth ultrasound 2/4: - EFW 1763g, 3#14, GP 28th% - repeat growth next week - patient receiving twice weekly testing for gestational hypertension diagnosis     Skin change 07/24/2023   Status post repeat low transverse cesarean section 08/25/2018   Supervision of high risk pregnancy,  antepartum 03/21/2018              Nursing Staff    Provider      Office Location     [facility]    Dating     LMP certain       Language     English    Anatomy US      normal      Flu Vaccine     Declined    Genetic Screen     NIPS: Low risk   AFP: Negative         TDaP vaccine      06/19/18    Hgb A1C or   GTT       Third trimester: neg      Rhogam     n/a         LAB RESULTS       Feeding Plan    Breast    Blood Type    B/Po    Past Surgical History: Past Surgical History:  Procedure Laterality Date   CESAREAN SECTION  2017, 2018   CESAREAN SECTION N/A 08/25/2018   Procedure: CESAREAN SECTION;  Surgeon: Nicholaus Burnard HERO, MD;  Location: [facility] LD ORS;  Service: Obstetrics;  Laterality: N/A;   CESAREAN SECTION     TONSILLECTOMY     2nd grade   WISDOM TOOTH EXTRACTION      Obstetrical History: OB History     [place]  5   Para  4   Term  4   Preterm      AB      Living  4      SAB      IAB      Ectopic      Multiple      Live Births  4           Social History Social History   Socioeconomic History   Marital status: Single    Spouse name: Not on file   Number of children: 2   Years of education: 12   Highest education level: GED or equivalent  Occupational History   Occupation: Lobbyist: [employer]  Tobacco Use   Smoking status: Former    Types: Cigars   Smokeless tobacco: Never   Tobacco comments:    Smoked for 2-3 months and around December 2022.   Vaping Use   Vaping status: Never Used  Substance and Sexual Activity   Alcohol use: Not Currently   Drug use: Never   Sexual activity: Yes    Partners: Male    Birth control/protection: Condom    Comment: First IC @ 28y/o, Hx of CT+  Other Topics Concern   Not on file  Social History Narrative   Not on file   Social Drivers of Health   Financial Resource Strain: Low Risk  (07/20/2023)   Overall Financial Resource Strain (CARDIA)    Difficulty of Paying Living Expenses: Not hard at all  Food  Insecurity: No Food Insecurity (11/20/2023)   Hunger Vital Sign    Worried About Running Out of Food in the Last Year: Never true    Ran Out of Food in the Last Year: Never true  Transportation Needs: No Transportation Needs (11/20/2023)   PRAPARE - Administrator, Civil Service (Medical): No    Lack of Transportation (Non-Medical): No  Physical Activity: Insufficiently Active (07/20/2023)   Exercise Vital Sign    Days of Exercise per Week: 3 days    Minutes of Exercise per Session: 30 min  Stress: No Stress Concern Present (07/20/2023)   [employer] of Occupational Health - Occupational Stress Questionnaire    Feeling of Stress : Only a little  Social Connections: Moderately Isolated (07/20/2023)   Social Connection and Isolation Panel    Frequency of Communication with Friends and Family: More than three times a week    Frequency of Social Gatherings with Friends and Family: Once a week    Attends Religious Services: Never    Database administrator or Organizations: No    Attends Engineer, structural: Not on file    Marital Status: Living with partner    Family History: Family History  Problem Relation Age of Onset   Diabetes Mother    HIV Mother    Asthma Mother    Asthma Brother    Breast cancer Maternal Grandmother     Allergies: No Known Allergies  Medications Prior to Admission  Medication Sig Dispense Refill Last Dose/Taking   aspirin  EC 81 MG tablet Take 2 tablets (162 mg total) by mouth daily. Swallow whole. 60 tablet 12    Blood Pressure Monitoring (BLOOD PRESSURE KIT) DEVI 1 Device by Does not apply route as needed. 1 each 0    cyclobenzaprine  (FLEXERIL ) 10 MG tablet Take 1 tablet (10 mg total) by mouth 2 (two) times daily as needed for muscle spasms. 20 tablet 0  Misc. Devices (GOJJI WEIGHT SCALE) MISC 1 Device by Does not apply route as needed. 1 each 0    Prenatal Vit-Fe Fumarate-FA (MULTIVITAMIN-PRENATAL) 27-0.8 MG TABS tablet Take 1  tablet by mouth daily at 12 noon.        Review of Systems   All systems reviewed and negative except as stated in HPI  Blood pressure 134/82, pulse (!) 138, temperature 98.5 F (36.9 C), resp. rate 18, height 5' 7 (1.702 m), weight (!) 145.2 kg, last menstrual period 07/26/2023. General appearance: alert, cooperative, and appears stated age Lungs: clear to auscultation bilaterally Heart: regular rate and rhythm Abdomen: soft, non-tender; bowel sounds normal Pelvic: adequate Extremities: Homans sign is negative, no sign of DVT DTR's +2 Presentation: unsure Fetal monitoring no heart tones present on Usx2. No monitoring indicated in labor Uterine activityDate/time of onset: 12/13/2023 @ 1500, Frequency: Every 4-5 minutes, Duration: 60 seconds, and Intensity: moderate     Prenatal labs: ABO, Rh: B/Positive/-- (09/03 1038) Antibody: Positive, See Final Results (09/03 1038) Rubella: 7.39 (09/03 1038) RPR: Non Reactive (09/03 1038)  HBsAg: Negative (09/03 1038)  HIV: Non Reactive (09/03 1038)  GBS:      Lab Results  Component Value Date   GBS Positive (A) 08/04/2018   GTT not completed due to gestational age Genetic screening  LR female Anatomy US  not completed  Immunization History  Administered Date(s) Administered   Influenza,inj,Quad PF,6+ Mos 02/02/2021   Moderna Sars-Covid-2 Vaccination 05/24/2021   PFIZER Comirnaty(Gray Top)Covid-19 Tri-Sucrose Vaccine 07/22/2019   PNEUMOCOCCAL CONJUGATE-20 07/24/2023   Pfizer(Comirnaty)Fall Seasonal Vaccine 12 years and older 07/24/2023   Tdap 03/21/2017, 06/19/2018    Prenatal Transfer Tool  Maternal Diabetes: No Genetic Screening: Normal Maternal Ultrasounds/Referrals: Normal and Other: Dating US  only, SCH noted Fetal Ultrasounds or other Referrals:  None Maternal Substance Abuse:  No Significant Maternal Medications:  None Significant Maternal Lab Results: None Number of Prenatal Visits:Less than or equal to 3 verified  prenatal visits, due to gestational age Maternal Vaccinations:Covid Other Comments:  None   Results for orders placed or performed during the hospital encounter of 12/13/23 (from the past 24 hours)  Wet prep, genital   Collection Time: 12/13/23  6:06 PM   Specimen: Cervical/Vaginal swab  Result Value Ref Range   Yeast Wet Prep HPF POC NONE SEEN NONE SEEN   Trich, Wet Prep NONE SEEN NONE SEEN   Clue Cells Wet Prep HPF POC NONE SEEN NONE SEEN   WBC, Wet Prep HPF POC >=10 (A) <10   Sperm NONE SEEN     Patient Active Problem List   Diagnosis Date Noted   Supervision of other normal pregnancy, antepartum 11/07/2023   Mixed hyperlipidemia 07/24/2023   Morbid obesity with BMI of 50.0-59.9, adult (HCC) 07/24/2023   MDD (recurrent major depressive disorder) in remission 04/03/2022   Body mass index (BMI) 50.0-59.9, adult (HCC) 06/02/2021   Major depression in remission 05/24/2021   History of cesarean section 08/25/2018    Assessment/Plan:  Catherine Cochran is a 28 y.o. G5P4004 at [redacted]w[redacted]d here for miscarriage diagnosed in MAU today.   #Labor: IOL for demise. Cytotec  400mg  q4h.  #Pain: Epidural #FWB: NA #GBS status:  Unknown, NA #Reproductive Life planning: Not Discussed #Miscarriage at 19 weeks. No fetal monitoring. Pain relief as desired by patient.  #Placental abruption. CBC on admission. Watch for bleeding.   Camie DELENA Rote, CNM  12/13/2023, 6:49 PM

## 2023-12-13 NOTE — Progress Notes (Signed)
 CNM called to room for pt blood pressure 70s/40s and pt feeling dizzy.  I arrived in room and pt supine, reporting she feels rectal pressure and dizzy/lightheaded.  Fluid bolus running.  BP up to 95/71.  Vernell Ruddle, SNM, checked patient and cervix unchanged at 5cm with umbilical cord palpable but no fetal parts outside of cervix.  Pt pushed with urge x 1-2 times but no change to cervix or position.  Dr Ozan to room to evaluate bleeding.  Discussed epidural with patient for pain relief. CNM called anesthesia to notify about hypotension and need for pt to remain supine  until right before epidural.  Plan to give TXA close to time of delivery discussed with Dr Ozan.

## 2023-12-13 NOTE — MAU Provider Note (Signed)
 None     [name] is a 28 y.o. [id number] pregnant female at [redacted]w[redacted]d who presents to MAU today with complaint of vaginal bleeding, worsening throughout the day. Contractions started at approximately 1500 and are getting more intense. SABRA   Receives care at [facility] Prenatal records reviewed. Known large SCH.   Pertinent items noted in HPI and remainder of comprehensive ROS otherwise negative.   O BP 134/82   Pulse (!) 138   Temp 98.5 F (36.9 C)   Resp 18   Ht 5' 7 (1.702 m)   Wt (!) 145.2 kg   LMP 07/26/2023   BMI 50.12 kg/m  Physical Exam Vitals and nursing note reviewed. Exam conducted with a chaperone present.  Constitutional:      Appearance: She is well-developed.  Cardiovascular:     Rate and Rhythm: Normal rate.  Pulmonary:     Effort: Pulmonary effort is normal.  Abdominal:     Comments: Gravid  Genitourinary:    Exam position: Lithotomy position.     Vagina: Bleeding present.     Cervix: Dilated. Cervical bleeding present.     Comments: Speculum to obtain swabs and visualize cervix, large amount of blood noted in vaginal vault. Once speculum reached cervix, large amount of bleeding noted, cleared with fox swabs. Cervix appears visually dilated unclear if fetal parts, blood clot or membranes present at os.  Skin:    General: Skin is warm and dry.     Capillary Refill: Capillary refill takes less than 2 seconds.  Neurological:     General: No focal deficit present.     Mental Status: She is alert.  Psychiatric:        Mood and Affect: Mood is anxious.        Behavior: Behavior normal.     Comments: Appropriate for situation      MDM: Moderate Rule out PTL - Unable to obtain FHT on BSUS. US  as below.  - Would not collect FFN due to gestational age. Ultrasound for placenta, cervical length and heartbeat. No heart beat present.  Speculum exam.    MAU Course:  A History of cesarean section  Supervision of other normal pregnancy,  antepartum  Miscarriage  Placental abruption in second trimester  Vaginal bleeding  Medical screening exam complete  P Admit to L&D for management of miscarriage.  Discussed pain management with patient, she desires epidural.  Offered condolences, briefly discussed bereavement options.   Camie Rote, MSN, CNM 12/13/2023 6:39 PM  Certified Nurse Midwife, [facility]

## 2023-12-13 NOTE — Progress Notes (Signed)
 To bedside for RN concern of vaginal bleeding with clots and tissue expressed. Bleeding consistent with abruption. Patient reported urge to push, clots only expressed. SCE completed, no fetal parts felt in vaginal canal. Suspect placenta first to deliver due to consistency of tissue palpated behind cervix.   Requested Dr. Ozan presence at bedside, she was able to come immediately and reassure patient. She placed orders to assess bleeding baseline.   Dilation:5 Effacement (%): 90 Presentation: Undeterminable  Exam by:: GORMAN Rote, CNM   Camie Rote, MSN, CNM, RNC-OB Certified Nurse Midwife, [facility] 12/13/2023 8:31 PM

## 2023-12-13 NOTE — Progress Notes (Addendum)
 Called to bedside due to tachycardia and hypotension.  Total EBL now ~ 715cc.  Pt given IV fluids including albumin .  cRNA at beside administering pressor.  Abdomen soft, pt noting vaginal discomfort only.  On vaginal exam-bleeding light to moderate- no passage of clots.  Cervix still ~ 5cm and suspect large amount of blood within the uterus.  Foley placed.  Vitals improved with support.  Discussed concern with patient that should bleeding continue/worsen may need to proceed with surgical intervention.  Discussed D&E- pt would prefer to avoid if possible.  DIC labs were recently drawn @ 2050- within normal range.  CBC completed.  Hgb dropped from 11.1 to 7.1  At this time, recommended proceeding towards surgical intervention in light of active vaginal bleeding remote from delivery.  Discussed plan for dilation and evacuation under US  guidance.  Reviewed risk including risk of bleeding, infection and injury requiring further surgical intervention.  Discussed concern for acute blood loss and possible need for transfusion- pt agreeable.  Also discussed that while currently, there is no evidence of uterine rupture- should that be noted, further surgical intervention may be required.  Questions/concerns were addressed and pt desires to proceed.  Rowyn Mustapha, DO Attending Obstetrician & Gynecologist, [facility] for [employer], [facility]

## 2023-12-13 NOTE — Anesthesia Preprocedure Evaluation (Addendum)
 Anesthesia Evaluation  Patient identified by MRN, date of birth, ID band Patient awake    Reviewed: Allergy & Precautions, NPO status , Patient's Chart, lab work & pertinent test results  Airway Mallampati: II  TM Distance: >3 FB Neck ROM: Full    Dental no notable dental hx. (+) Teeth Intact   Pulmonary former smoker   Pulmonary exam normal breath sounds clear to auscultation       Cardiovascular hypertension, Normal cardiovascular exam Rhythm:Regular Rate:Normal  Hx/o pre eclampsia with prior C/Section   Neuro/Psych negative neurological ROS  negative psych ROS   GI/Hepatic Neg liver ROS,GERD  Medicated and Controlled,,  Endo/Other    Class 3 obesity  Renal/GU negative Renal ROS  negative genitourinary   Musculoskeletal   Abdominal  (+) + obese  Peds  Hematology  (+) Blood dyscrasia, anemia   Anesthesia Other Findings   Reproductive/Obstetrics (+) Pregnancy Previous C/Section x 2 39 weeks                              Anesthesia Physical Anesthesia Plan  ASA: III  Anesthesia Plan: Spinal   Post-op Pain Management:    Induction:   PONV Risk Score and Plan: 4 or greater and Ondansetron , Scopolamine  patch - Pre-op and Treatment may vary due to age or medical condition  Airway Management Planned: Natural Airway  Additional Equipment:   Intra-op Plan:   Post-operative Plan:   Informed Consent: I have reviewed the patients History and Physical, chart, labs and discussed the procedure including the risks, benefits and alternatives for the proposed anesthesia with the patient or authorized representative who has indicated his/her understanding and acceptance.     Dental advisory given  Plan Discussed with: CRNA and Surgeon  Anesthesia Plan Comments:          Anesthesia Quick Evaluation

## 2023-12-14 ENCOUNTER — Encounter (HOSPITAL_COMMUNITY): Payer: Self-pay | Admitting: Obstetrics & Gynecology

## 2023-12-14 ENCOUNTER — Other Ambulatory Visit: Payer: Self-pay

## 2023-12-14 ENCOUNTER — Inpatient Hospital Stay (HOSPITAL_COMMUNITY)

## 2023-12-14 DIAGNOSIS — O021 Missed abortion: Secondary | ICD-10-CM | POA: Diagnosis not present

## 2023-12-14 DIAGNOSIS — Z3A00 Weeks of gestation of pregnancy not specified: Secondary | ICD-10-CM

## 2023-12-14 LAB — DIC (DISSEMINATED INTRAVASCULAR COAGULATION)PANEL
D-Dimer, Quant: 0.52 ug{FEU}/mL — ABNORMAL HIGH (ref 0.00–0.50)
Fibrinogen: 596 mg/dL — ABNORMAL HIGH (ref 210–475)
INR: 1 (ref 0.8–1.2)
Platelets: 364 K/uL (ref 150–400)
Prothrombin Time: 13.9 s (ref 11.4–15.2)
Smear Review: NONE SEEN
aPTT: 29 s (ref 24–36)

## 2023-12-14 LAB — CBC
HCT: 18.6 % — ABNORMAL LOW (ref 36.0–46.0)
HCT: 22.4 % — ABNORMAL LOW (ref 36.0–46.0)
Hemoglobin: 6.1 g/dL — CL (ref 12.0–15.0)
Hemoglobin: 7.6 g/dL — ABNORMAL LOW (ref 12.0–15.0)
MCH: 29.2 pg (ref 26.0–34.0)
MCH: 29.9 pg (ref 26.0–34.0)
MCHC: 32.8 g/dL (ref 30.0–36.0)
MCHC: 33.9 g/dL (ref 30.0–36.0)
MCV: 89 fL (ref 80.0–100.0)
MCV: 88.2 fL (ref 80.0–100.0)
Platelets: 262 K/uL (ref 150–400)
Platelets: 269 K/uL (ref 150–400)
RBC: 2.09 MIL/uL — ABNORMAL LOW (ref 3.87–5.11)
RBC: 2.54 MIL/uL — ABNORMAL LOW (ref 3.87–5.11)
RDW: 14.4 % (ref 11.5–15.5)
RDW: 14.2 % (ref 11.5–15.5)
WBC: 18.6 K/uL — ABNORMAL HIGH (ref 4.0–10.5)
WBC: 22.2 K/uL — ABNORMAL HIGH (ref 4.0–10.5)
nRBC: 0 % (ref 0.0–0.2)
nRBC: 0.1 % (ref 0.0–0.2)

## 2023-12-14 LAB — RPR: RPR Ser Ql: NONREACTIVE

## 2023-12-14 LAB — PREPARE RBC (CROSSMATCH)

## 2023-12-14 MED ORDER — AMISULPRIDE (ANTIEMETIC) 5 MG/2ML IV SOLN: 10.0000 mg | Freq: Once | INTRAVENOUS | Status: DC | PRN

## 2023-12-14 MED ORDER — PHENYLEPHRINE 80 MCG/ML (10ML) SYRINGE FOR IV PUSH (FOR BLOOD PRESSURE SUPPORT): PREFILLED_SYRINGE | INTRAVENOUS | Status: DC | PRN

## 2023-12-14 MED ORDER — PRENATAL MULTIVITAMIN CH: 1.0000 | ORAL_TABLET | Freq: Every day | ORAL | Status: DC

## 2023-12-14 MED ORDER — DEXMEDETOMIDINE HCL IN NACL 80 MCG/20ML IV SOLN
INTRAVENOUS | Status: DC | PRN
  Administered 2023-12-13 – 2023-12-14 (×3): 8 ug via INTRAVENOUS
  Administered 2023-12-14: 16 ug via INTRAVENOUS

## 2023-12-14 MED ORDER — TRANEXAMIC ACID-NACL 1000-0.7 MG/100ML-% IV SOLN
INTRAVENOUS | Status: DC | PRN
  Administered 2023-12-13: 1000 mg via INTRAVENOUS

## 2023-12-14 MED ORDER — BENZOCAINE-MENTHOL 20-0.5 % EX AERO: 1.0000 | INHALATION_SPRAY | CUTANEOUS | Status: DC | PRN

## 2023-12-14 MED ORDER — DEXTROSE 5 % IV SOLN
INTRAVENOUS | Status: DC | PRN
  Administered 2023-12-13: 3 g via INTRAVENOUS

## 2023-12-14 MED ORDER — FENTANYL CITRATE (PF) 100 MCG/2ML IJ SOLN
INTRAMUSCULAR | Status: DC | PRN
  Administered 2023-12-13: 15 ug via INTRATHECAL
  Administered 2023-12-13: 85 ug via INTRATHECAL

## 2023-12-14 MED ORDER — ACETAMINOPHEN 325 MG PO TABS: 650.0000 mg | ORAL_TABLET | ORAL | Status: DC | PRN

## 2023-12-14 MED ORDER — SODIUM CHLORIDE 0.9 % IV SOLN: 12.5000 mg | INTRAVENOUS | Status: DC | PRN

## 2023-12-14 MED ORDER — FENTANYL CITRATE (PF) 100 MCG/2ML IJ SOLN
INTRAMUSCULAR | Status: DC | PRN
  Administered 2023-12-13: 85 ug via INTRAVENOUS

## 2023-12-14 MED ORDER — DEXAMETHASONE SODIUM PHOSPHATE 10 MG/ML IJ SOLN
INTRAMUSCULAR | Status: DC | PRN
  Administered 2023-12-14: 5 mg via INTRAVENOUS

## 2023-12-14 MED ORDER — DIBUCAINE (PERIANAL) 1 % EX OINT: 1.0000 | TOPICAL_OINTMENT | CUTANEOUS | Status: DC | PRN

## 2023-12-14 MED ORDER — ACETAMINOPHEN 325 MG PO TABS: 650.0000 mg | ORAL_TABLET | ORAL | 0 refills | Status: AC | PRN

## 2023-12-14 MED ORDER — DIPHENHYDRAMINE HCL 25 MG PO CAPS
25.0000 mg | ORAL_CAPSULE | Freq: Four times a day (QID) | ORAL | Status: DC | PRN
  Filled 2023-12-14: qty 1

## 2023-12-14 MED ORDER — SENNOSIDES-DOCUSATE SODIUM 8.6-50 MG PO TABS: 2.0000 | ORAL_TABLET | ORAL | Status: DC

## 2023-12-14 MED ORDER — ZOLPIDEM TARTRATE 5 MG PO TABS: 5.0000 mg | ORAL_TABLET | Freq: Every evening | ORAL | Status: DC | PRN

## 2023-12-14 MED ORDER — STERILE WATER FOR IRRIGATION IR SOLN
Status: DC | PRN
  Administered 2023-12-14: 1000 mL

## 2023-12-14 MED ORDER — OXYCODONE HCL 5 MG/5ML PO SOLN: 5.0000 mg | Freq: Once | ORAL | Status: DC | PRN

## 2023-12-14 MED ORDER — IBUPROFEN 600 MG PO TABS
600.0000 mg | ORAL_TABLET | Freq: Four times a day (QID) | ORAL | Status: DC
  Administered 2023-12-14: 600 mg via ORAL
  Filled 2023-12-14: qty 1

## 2023-12-14 MED ORDER — OXYCODONE HCL 5 MG PO TABS: 5.0000 mg | ORAL_TABLET | Freq: Once | ORAL | Status: DC | PRN

## 2023-12-14 MED ORDER — COCONUT OIL OIL: 1.0000 | TOPICAL_OIL | Status: DC | PRN

## 2023-12-14 MED ORDER — SIMETHICONE 80 MG PO CHEW: 80.0000 mg | CHEWABLE_TABLET | ORAL | Status: DC | PRN

## 2023-12-14 MED ORDER — HYDROMORPHONE HCL 1 MG/ML IJ SOLN: 0.2500 mg | INTRAMUSCULAR | Status: DC | PRN

## 2023-12-14 MED ORDER — SODIUM CHLORIDE 0.9% IV SOLUTION: Freq: Once | INTRAVENOUS | Status: AC

## 2023-12-14 MED ORDER — MORPHINE SULFATE (PF) 0.5 MG/ML IJ SOLN
INTRAMUSCULAR | Status: DC | PRN
  Administered 2023-12-13: 150 ug via INTRATHECAL

## 2023-12-14 MED ORDER — BUPIVACAINE IN DEXTROSE 0.75-8.25 % IT SOLN
INTRATHECAL | Status: DC | PRN
  Administered 2023-12-13: 1.6 mL via INTRATHECAL

## 2023-12-14 MED ORDER — WITCH HAZEL-GLYCERIN EX PADS: 1.0000 | MEDICATED_PAD | CUTANEOUS | Status: DC | PRN

## 2023-12-14 MED ORDER — IBUPROFEN 600 MG PO TABS: 600.0000 mg | ORAL_TABLET | Freq: Four times a day (QID) | ORAL | 0 refills | Status: AC

## 2023-12-14 NOTE — Plan of Care (Signed)

## 2023-12-14 NOTE — Discharge Summary (Signed)
 Postpartum Discharge Summary   Patient Name: Catherine Cochran DOB: 13-May-1995 MRN: [id number]  Date of admission: 12/13/2023 Delivery date:12/13/2023 Delivering provider: MARILYNN NEST Date of discharge: 12/14/2023  Admitting diagnosis: Miscarriage [O03.9] Intrauterine pregnancy: [redacted]w[redacted]d     Secondary diagnosis:  Principal Problem:   Miscarriage Active Problems:   Retained placenta with hemorrhage  Additional problems: Fetal demise @ 19wks, Hemorrhage due to retained placenta, Obesity, Anemia due to acute blood loss    Discharge diagnosis: Fetal demise @ 19wks, Hemorrhage due to retained placenta, Obesity, Anemia due to acute blood loss                                              Post partum procedures:blood transfusion and curettage  Augmentation: cytotec  Complications: Hemorrhage>1035mL  Hospital course:   28 y.o. yo [id number] at [redacted]w[redacted]d was admitted for fetal demise due to placental abruption.  On initial admission patient was noted to have moderate vaginal bleeding.  She was augmented with vaginal Cytotec .  During her labor course, she noted to become hypotensive and tachycardic, patient was given fluids pressors and hemoglobin had dropped from 11.1-7.1.  Due to hemodynamic instability, patient was taken for D&E due to retained products under ultrasound guidance.  Please see operative note for further information.  Based on findings during surgery and heavy bleeding, delivery likely occurred unwitnessed.  On POD #0, follow-up hemoglobin was noted to be 6.1, patient was transfused 2 units PRBC. Her post-transfusion hemoglobin was 7.6. She was ambulating without dizziness or lightheadedness, voiding, pain controlled and no significant bleeding. She was requesting discharge home on HD2. She declined to see social work prior to discharge. She was offered [facility] referral as an outpatient and she was interested in this. Her postpartum EDS was 3. She had good family support at bedside. She was discharged  home.  Newborn Data: Birth date:12/13/2023 Birth time:7:10 PM Gender:Female Living status:Fetal Demise Apgars: ,  Weight:   Magnesium Sulfate received: No BMZ received: No Rhophylac:No MMR:No T-DaP:NA Flu: No RSV Vaccine received: No Transfusion:Yes  Immunizations received: Immunization History  Administered Date(s) Administered   Influenza,inj,Quad PF,6+ Mos 02/02/2021   Moderna Sars-Covid-2 Vaccination 05/24/2021   PFIZER Comirnaty(Gray Top)Covid-19 Tri-Sucrose Vaccine 07/22/2019   PNEUMOCOCCAL CONJUGATE-20 07/24/2023   Pfizer(Comirnaty)Fall Seasonal Vaccine 12 years and older 07/24/2023   Tdap 03/21/2017, 06/19/2018    Physical exam  Vitals:   12/14/23 0919 12/14/23 0941 12/14/23 1209 12/14/23 1617  BP: 117/65 (!) 100/49 117/80 127/73  Pulse: 98 80 81 96  Resp: 20 16 16 18   Temp: 97.7 F (36.5 C) 98.1 F (36.7 C) 98 F (36.7 C) 97.6 F (36.4 C)  TempSrc: Oral Oral Oral Oral  SpO2: 97% 96% 99% 100%  Weight:      Height:       General: alert, cooperative, and no distress Lochia: appropriate Uterine Fundus: firm Incision: N/A DVT Evaluation: No significant calf/ankle edema. Labs: Lab Results  Component Value Date   WBC 22.2 (H) 12/14/2023   HGB 7.6 (L) 12/14/2023   HCT 22.4 (L) 12/14/2023   MCV 88.2 12/14/2023   PLT 269 12/14/2023      Latest Ref Rng & Units 11/20/2023   10:38 AM  CMP  Glucose 70 - 99 mg/dL 86   BUN 6 - 20 mg/dL 6   Creatinine 9.42 - 8.99 mg/dL 9.41   Sodium 865 -  144 mmol/L 137   Potassium 3.5 - 5.2 mmol/L 4.0   Chloride 96 - 106 mmol/L 102   CO2 20 - 29 mmol/L 20   Calcium 8.7 - 10.2 mg/dL 9.5   Total Protein 6.0 - 8.5 g/dL 6.3   Total Bilirubin 0.0 - 1.2 mg/dL <9.7   Alkaline Phos 44 - 121 IU/L 74   AST 0 - 40 IU/L 10   ALT 0 - 32 IU/L 6    Edinburgh Score:    12/14/2023   12:00 PM  Edinburgh Postnatal Depression Scale Screening Tool  I have been able to laugh and see the funny side of things. 1  I have looked forward  with enjoyment to things. 0  I have blamed myself unnecessarily when things went wrong. 1  I have been anxious or worried for no good reason. 0  I have felt scared or panicky for no good reason. 0  Things have been getting on top of me. 1  I have been so unhappy that I have had difficulty sleeping. 0  I have felt sad or miserable. 0  I have been so unhappy that I have been crying. 0  The thought of harming myself has occurred to me. 0  Edinburgh Postnatal Depression Scale Total 3   Edinburgh Postnatal Depression Scale Total: 3   After visit meds:  Allergies as of 12/14/2023   No Known Allergies      Medication List     STOP taking these medications    aspirin  EC 81 MG tablet   cyclobenzaprine  10 MG tablet Commonly known as: FLEXERIL        TAKE these medications    acetaminophen  325 MG tablet Commonly known as: Tylenol  Take 2 tablets (650 mg total) by mouth every 4 (four) hours as needed (for pain scale < 4).   Blood Pressure Kit Devi 1 Device by Does not apply route as needed.   Gojji Weight Scale Misc 1 Device by Does not apply route as needed.   ibuprofen  600 MG tablet Commonly known as: ADVIL  Take 1 tablet (600 mg total) by mouth every 6 (six) hours.   multivitamin-prenatal 27-0.8 MG Tabs tablet Take 1 tablet by mouth daily at 12 noon.         Discharge home in stable condition Infant Feeding: n/a Infant Disposition:n/a Discharge instruction: per After Visit Summary and Postpartum booklet. Activity: Advance as tolerated. Pelvic rest for 6 weeks.  Diet: routine diet Future Appointments: Future Appointments  Date Time Provider Department Center  12/19/2023  8:00 AM [facility] PROVIDER 1 [facility]  12/19/2023  8:30 AM [facility] US1 [facility]  12/19/2023 10:15 AM Izell Harari, MD [facility]  07/27/2024  8:20 AM Georgina Speaks, FNP [facility] [phone] [facility]   Follow up Visit:  Please schedule this patient for Postpartum visit in: 2-4 weeks with the  following provider: Any provider In-Person For C/S patients schedule nurse incision check in 1-2 weeks: n/a High risk pregnancy complicated by: IUFD Delivery mode:  SVD Anticipated Birth Control:  other/unsure PP Procedures needed: BH follow up  Edinburgh: negative Schedule Integrated BH visit: yes - IUFD Relevant baby issues - IUFD   12/14/2023 Rollo ONEIDA Bring, MD

## 2023-12-14 NOTE — Progress Notes (Signed)
 POSTPARTUM PROGRESS NOTE- 2nd trimester loss  POD #1  Subjective:  Catherine Cochran is a 28 y.o. H4E5985 s/p SVD at [redacted]w[redacted]d and POD#1 from EUA for retained placenta. Today she notes that she has been very sleepy.  She has not yet ambulated, foley still in place.  Denies fever or chills.  Denies CP or SOB.  Objective: Blood pressure 116/71, pulse (!) 104, temperature 98.3 F (36.8 C), temperature source Oral, resp. rate 16, height 5' 7 (1.702 m), weight (!) 145.2 kg, last menstrual period 07/26/2023, SpO2 98%.  Physical Exam:  General: alert, cooperative and no distress Chest: no respiratory distress Heart: regular rate and rhythm Abdomen: obese, soft, nontender, no rebound, no guarding Uterine Fundus: firm, below umblicus DVT Evaluation: No calf swelling or tenderness Extremities: no edema Skin: warm, dry  Results for orders placed or performed during the hospital encounter of 12/13/23 (from the past 24 hours)  Wet prep, genital     Status: Abnormal   Collection Time: 12/13/23  6:06 PM   Specimen: Cervical/Vaginal swab  Result Value Ref Range   Yeast Wet Prep HPF POC NONE SEEN NONE SEEN   Trich, Wet Prep NONE SEEN NONE SEEN   Clue Cells Wet Prep HPF POC NONE SEEN NONE SEEN   WBC, Wet Prep HPF POC >=10 (A) <10   Sperm NONE SEEN   CBC     Status: Abnormal   Collection Time: 12/13/23  6:43 PM  Result Value Ref Range   WBC 13.8 (H) 4.0 - 10.5 K/uL   RBC 3.87 3.87 - 5.11 MIL/uL   Hemoglobin 11.1 (L) 12.0 - 15.0 g/dL   HCT 65.8 (L) 63.9 - 53.9 %   MCV 88.1 80.0 - 100.0 fL   MCH 28.7 26.0 - 34.0 pg   MCHC 32.6 30.0 - 36.0 g/dL   RDW 85.5 88.4 - 84.4 %   Platelets 357 150 - 400 K/uL   nRBC 0.0 0.0 - 0.2 %  Type and screen [facility]     Status: None (Preliminary result)   Collection Time: 12/13/23  6:46 PM  Result Value Ref Range   ABO/RH(D) B POS    Antibody Screen POS    Sample Expiration 12/16/2023,2359    Antibody Identification ANTI LEA (Lewis a)    Unit  Number [id number]    Blood Component Type RBC LR PHER1    Unit division 00    Status of Unit ALLOCATED    Transfusion Status OK TO TRANSFUSE    Crossmatch Result COMPATIBLE    Unit Number [id number]    Blood Component Type RBC LR PHER2    Unit division 00    Status of Unit ALLOCATED    Transfusion Status OK TO TRANSFUSE    Crossmatch Result COMPATIBLE    Unit Number [id number]    Blood Component Type RED CELLS,LR    Unit division 00    Status of Unit ALLOCATED    Transfusion Status OK TO TRANSFUSE    Crossmatch Result COMPATIBLE    Unit Number [id number]    Blood Component Type RED CELLS,LR    Unit division 00    Status of Unit ALLOCATED    Transfusion Status OK TO TRANSFUSE    Crossmatch Result COMPATIBLE    Unit Number [id number]    Blood Component Type RED CELLS,LR    Unit division 00    Status of Unit ALLOCATED    Transfusion Status OK TO TRANSFUSE    Crossmatch Result COMPATIBLE  DIC Panel ONCE - STAT     Status: Abnormal   Collection Time: 12/13/23  8:53 PM  Result Value Ref Range   Prothrombin Time 13.9 11.4 - 15.2 seconds   INR 1.0 0.8 - 1.2   aPTT 29 24 - 36 seconds   Fibrinogen 596 (H) 210 - 475 mg/dL   D-Dimer, Quant 9.47 (H) 0.00 - 0.50 ug/mL-FEU   Platelets 364 150 - 400 K/uL   Smear Review NO SCHISTOCYTES SEEN   CBC     Status: Abnormal   Collection Time: 12/13/23 10:37 PM  Result Value Ref Range   WBC 17.5 (H) 4.0 - 10.5 K/uL   RBC 2.56 (L) 3.87 - 5.11 MIL/uL   Hemoglobin 7.4 (L) 12.0 - 15.0 g/dL   HCT 77.1 (L) 63.9 - 53.9 %   MCV 89.1 80.0 - 100.0 fL   MCH 28.9 26.0 - 34.0 pg   MCHC 32.5 30.0 - 36.0 g/dL   RDW 85.5 88.4 - 84.4 %   Platelets 340 150 - 400 K/uL   nRBC 0.0 0.0 - 0.2 %  Prepare RBC (crossmatch)     Status: None   Collection Time: 12/13/23 11:22 PM  Result Value Ref Range   Order Confirmation      ORDER PROCESSED BY BLOOD BANK Performed at [facility], [address] [place], [place] [address]   CBC      Status: Abnormal   Collection Time: 12/14/23  4:13 AM  Result Value Ref Range   WBC 18.6 (H) 4.0 - 10.5 K/uL   RBC 2.09 (L) 3.87 - 5.11 MIL/uL   Hemoglobin 6.1 (LL) 12.0 - 15.0 g/dL   HCT 81.3 (L) 63.9 - 53.9 %   MCV 89.0 80.0 - 100.0 fL   MCH 29.2 26.0 - 34.0 pg   MCHC 32.8 30.0 - 36.0 g/dL   RDW 85.5 88.4 - 84.4 %   Platelets 262 150 - 400 K/uL   nRBC 0.0 0.0 - 0.2 %    Assessment/Plan: Catherine Cochran is a 28 y.o. H4E5985 s/p SVD at [redacted]w[redacted]d/ EUA due to retained placenta, POD#0   -Hgb 6.1, discussed plan for transfusion of 2u pRBC this am -Foley in place- encouraged ambulation and plan to discontinue today -ambulate as tolerated -regular diet -Pt strongly desires to go home, possible discharge later today    LOS: 1 day   Chloris Marcoux, DO Faculty Attending, Center for [facility] 12/14/2023, 5:39 AM

## 2023-12-14 NOTE — Transfer of Care (Signed)
 Immediate Anesthesia Transfer of Care Note  Patient: Catherine Cochran  Procedure(s) Performed: DELIVERY, VAGINAL  Patient Location: PACU  Anesthesia Type:Regional  Level of Consciousness: awake, alert , and oriented  Airway & Oxygen Therapy: Patient Spontanous Breathing and Patient connected to nasal cannula oxygen  Post-op Assessment: Report given to RN and Post -op Vital signs reviewed and stable  Post vital signs: Reviewed and stable  Last Vitals:  Vitals Value Taken Time  BP 99/62 12/14/23 00:30  Temp 36.6 C 12/14/23 00:30  Pulse 115 12/14/23 00:34  Resp 22 12/14/23 00:34  SpO2 100 % 12/14/23 00:34  Vitals shown include unfiled device data.  Last Pain:  Vitals:   12/14/23 0030  TempSrc: Oral  PainSc:          Complications: No notable events documented.

## 2023-12-14 NOTE — Anesthesia Postprocedure Evaluation (Signed)
 Anesthesia Post Note  Patient: Catherine Cochran  Procedure(s) Performed: DELIVERY, VAGINAL     Patient location during evaluation: PACU Anesthesia Type: Spinal Level of consciousness: awake and alert Pain management: pain level controlled Vital Signs Assessment: post-procedure vital signs reviewed and stable Respiratory status: spontaneous breathing, nonlabored ventilation and respiratory function stable Cardiovascular status: blood pressure returned to baseline and stable Postop Assessment: no apparent nausea or vomiting Anesthetic complications: no   No notable events documented.  Last Vitals:  Vitals:   12/14/23 0231 12/14/23 0300  BP: 107/65 116/71  Pulse: (!) 114 (!) 104  Resp:  16  Temp:  36.8 C  SpO2:  98%    Last Pain:  Vitals:   12/14/23 0310  TempSrc:   PainSc: 0-No pain   Pain Goal:                   Butler Levander Pinal

## 2023-12-14 NOTE — Anesthesia Procedure Notes (Signed)
 Spinal  Patient location during procedure: OR Start time: 12/14/2023 11:50 PM End time: 12/14/2023 11:55 PM Reason for block: surgical anesthesia Staffing Performed: anesthesiologist  Anesthesiologist: Cleotilde Butler Dade, MD Performed by: Cleotilde Butler Dade, MD Authorized by: Cleotilde Butler Dade, MD   Preanesthetic Checklist Completed: patient identified, IV checked, site marked, risks and benefits discussed, surgical consent, monitors and equipment checked, pre-op evaluation and timeout performed Spinal Block Patient position: sitting Prep: DuraPrep Patient monitoring: heart rate, cardiac monitor, continuous pulse ox and blood pressure Approach: midline Location: L3-4 Injection technique: single-shot Needle Needle type: Quincke  Needle gauge: 22 G Needle length: 9 cm Assessment Sensory level: T4 Events: CSF return

## 2023-12-14 NOTE — Op Note (Signed)
 Preoperative diagnosis: 2nd trimester loss, acute vaginal bleeding  Postoperative diagnosis: Retained placenta  Anesthesia:Spinal  Procedure: Exam under anesthesia, evacuation of the uterus under US  guidance  Surgeon: Dr. Delon Prude  Estimated blood loss: 50cc UOP: 100cc IVF: 200c  Procedure:  Pt was taken to the OR where spinal anesthesia was performed.  She was placed in lithotomy position.  She was prepped and draped in a sterile fashion.  Foley catheter was already in place.  A sterile speculum was placed and avulsed cord was seen.  There was also a large clot noted within cervical os.  US  did not show presence of fetus within the uterine cavity.  Under US  guidance- large clots with placenta was removed from within cervical os.  Sharp curettage was performed and confirmed complete evacuation. Significant improvement of bleeding noted.  Instruments were then removed. Instrument and sponge count is complete x2.  Suspect delivery of fetus occurred earlier this evening, possibly when pt was up to void.  Delivery likely unwitnessed when patient using the restroom around 1700.    The procedure is very well tolerated by the patient is taken to recovery room in a well and stable condition.  Specimen: Products of conception sent to pathology  Catherine Teagle, DO Attending Obstetrician & Gynecologist, Faculty Practice Center for [facility], [facility]

## 2023-12-14 NOTE — Plan of Care (Signed)
 Problem: Education: Goal: Knowledge of General Education information will improve Description: Including pain rating scale, medication(s)/side effects and non-pharmacologic comfort measures 12/14/2023 1724 by Arlys Alan RAMAN, RN Outcome: Completed/Met 12/14/2023 1040 by Arlys Alan RAMAN, RN Outcome: Progressing   Problem: Health Behavior/Discharge Planning: Goal: Ability to manage health-related needs will improve 12/14/2023 1724 by Arlys Alan RAMAN, RN Outcome: Completed/Met 12/14/2023 1040 by Arlys Alan RAMAN, RN Outcome: Progressing   Problem: Clinical Measurements: Goal: Ability to maintain clinical measurements within normal limits will improve 12/14/2023 1724 by Arlys Alan RAMAN, RN Outcome: Completed/Met 12/14/2023 1040 by Arlys Alan RAMAN, RN Outcome: Progressing Goal: Will remain free from infection 12/14/2023 1724 by Arlys Alan RAMAN, RN Outcome: Completed/Met 12/14/2023 1040 by Arlys Alan RAMAN, RN Outcome: Progressing Goal: Diagnostic test results will improve 12/14/2023 1724 by Arlys Alan RAMAN, RN Outcome: Completed/Met 12/14/2023 1040 by Arlys Alan RAMAN, RN Outcome: Progressing Goal: Respiratory complications will improve 12/14/2023 1724 by Arlys Alan RAMAN, RN Outcome: Completed/Met 12/14/2023 1040 by Arlys Alan RAMAN, RN Outcome: Progressing Goal: Cardiovascular complication will be avoided 12/14/2023 1724 by Arlys Alan RAMAN, RN Outcome: Completed/Met 12/14/2023 1040 by Arlys Alan RAMAN, RN Outcome: Progressing   Problem: Activity: Goal: Risk for activity intolerance will decrease 12/14/2023 1724 by Arlys Alan RAMAN, RN Outcome: Completed/Met 12/14/2023 1040 by Arlys Alan RAMAN, RN Outcome: Progressing   Problem: Nutrition: Goal: Adequate nutrition will be maintained 12/14/2023 1724 by Arlys Alan RAMAN, RN Outcome: Completed/Met 12/14/2023 1040 by Arlys Alan RAMAN, RN Outcome: Progressing   Problem: Coping: Goal: Level of anxiety will decrease 12/14/2023 1724 by Arlys Alan RAMAN, RN Outcome:  Completed/Met 12/14/2023 1040 by Arlys Alan RAMAN, RN Outcome: Progressing   Problem: Elimination: Goal: Will not experience complications related to bowel motility 12/14/2023 1724 by Arlys Alan RAMAN, RN Outcome: Completed/Met 12/14/2023 1040 by Arlys Alan RAMAN, RN Outcome: Progressing Goal: Will not experience complications related to urinary retention 12/14/2023 1724 by Arlys Alan RAMAN, RN Outcome: Completed/Met 12/14/2023 1040 by Arlys Alan RAMAN, RN Outcome: Progressing   Problem: Pain Managment: Goal: General experience of comfort will improve and/or be controlled 12/14/2023 1724 by Arlys Alan RAMAN, RN Outcome: Completed/Met 12/14/2023 1040 by Arlys Alan RAMAN, RN Outcome: Progressing   Problem: Safety: Goal: Ability to remain free from injury will improve 12/14/2023 1724 by Arlys Alan RAMAN, RN Outcome: Completed/Met 12/14/2023 1040 by Arlys Alan RAMAN, RN Outcome: Progressing   Problem: Skin Integrity: Goal: Risk for impaired skin integrity will decrease 12/14/2023 1724 by Arlys Alan RAMAN, RN Outcome: Completed/Met 12/14/2023 1040 by Arlys Alan RAMAN, RN Outcome: Progressing   Problem: Education: Goal: Knowledge of Childbirth will improve 12/14/2023 1724 by Arlys Alan RAMAN, RN Outcome: Completed/Met 12/14/2023 1040 by Arlys Alan RAMAN, RN Outcome: Progressing Goal: Ability to make informed decisions regarding treatment and plan of care will improve 12/14/2023 1724 by Arlys Alan RAMAN, RN Outcome: Completed/Met 12/14/2023 1040 by Arlys Alan RAMAN, RN Outcome: Progressing Goal: Ability to state and carry out methods to decrease the pain will improve 12/14/2023 1724 by Arlys Alan RAMAN, RN Outcome: Completed/Met 12/14/2023 1040 by Arlys Alan RAMAN, RN Outcome: Progressing Goal: Individualized Educational Video(s) 12/14/2023 1724 by Arlys Alan RAMAN, RN Outcome: Completed/Met 12/14/2023 1040 by Arlys Alan RAMAN, RN Outcome: Progressing   Problem: Coping: Goal: Ability to verbalize concerns and feelings about labor and  delivery will improve 12/14/2023 1724 by Arlys Alan RAMAN, RN Outcome: Completed/Met 12/14/2023 1040 by Arlys Alan RAMAN, RN Outcome: Progressing   Problem: Life Cycle: Goal: Ability to make normal progression  through stages of labor will improve 12/14/2023 1724 by Arlys Alan RAMAN, RN Outcome: Completed/Met 12/14/2023 1040 by Arlys Alan RAMAN, RN Outcome: Progressing Goal: Ability to effectively push during vaginal delivery will improve 12/14/2023 1724 by Arlys Alan RAMAN, RN Outcome: Completed/Met 12/14/2023 1040 by Arlys Alan RAMAN, RN Outcome: Progressing   Problem: Role Relationship: Goal: Will demonstrate positive interactions with the child 12/14/2023 1724 by Arlys Alan RAMAN, RN Outcome: Completed/Met 12/14/2023 1040 by Arlys Alan RAMAN, RN Outcome: Progressing   Problem: Safety: Goal: Risk of complications during labor and delivery will decrease 12/14/2023 1724 by Arlys Alan RAMAN, RN Outcome: Completed/Met 12/14/2023 1040 by Arlys Alan RAMAN, RN Outcome: Progressing   Problem: Pain Management: Goal: Relief or control of pain from uterine contractions will improve 12/14/2023 1724 by Arlys Alan RAMAN, RN Outcome: Completed/Met 12/14/2023 1040 by Arlys Alan RAMAN, RN Outcome: Progressing   Problem: Education: Goal: Knowledge of condition will improve 12/14/2023 1724 by Arlys Alan RAMAN, RN Outcome: Completed/Met 12/14/2023 1040 by Arlys Alan RAMAN, RN Outcome: Progressing Goal: Individualized Educational Video(s) 12/14/2023 1724 by Arlys Alan RAMAN, RN Outcome: Completed/Met 12/14/2023 1040 by Arlys Alan RAMAN, RN Outcome: Progressing Goal: Individualized Newborn Educational Video(s) 12/14/2023 1724 by Arlys Alan RAMAN, RN Outcome: Completed/Met 12/14/2023 1040 by Arlys Alan RAMAN, RN Outcome: Progressing   Problem: Activity: Goal: Will verbalize the importance of balancing activity with adequate rest periods 12/14/2023 1724 by Arlys Alan RAMAN, RN Outcome: Completed/Met 12/14/2023 1040 by Arlys Alan RAMAN, RN Outcome:  Progressing Goal: Ability to tolerate increased activity will improve 12/14/2023 1724 by Arlys Alan RAMAN, RN Outcome: Completed/Met 12/14/2023 1040 by Arlys Alan RAMAN, RN Outcome: Progressing   Problem: Coping: Goal: Ability to identify and utilize available resources and services will improve 12/14/2023 1724 by Arlys Alan RAMAN, RN Outcome: Completed/Met 12/14/2023 1040 by Arlys Alan RAMAN, RN Outcome: Progressing   Problem: Life Cycle: Goal: Chance of risk for complications during the postpartum period will decrease 12/14/2023 1724 by Arlys Alan RAMAN, RN Outcome: Completed/Met 12/14/2023 1040 by Arlys Alan RAMAN, RN Outcome: Progressing   Problem: Role Relationship: Goal: Ability to demonstrate positive interaction with newborn will improve 12/14/2023 1724 by Arlys Alan RAMAN, RN Outcome: Completed/Met 12/14/2023 1040 by Arlys Alan RAMAN, RN Outcome: Progressing   Problem: Skin Integrity: Goal: Demonstration of wound healing without infection will improve 12/14/2023 1724 by Arlys Alan RAMAN, RN Outcome: Completed/Met 12/14/2023 1040 by Arlys Alan RAMAN, RN Outcome: Progressing

## 2023-12-15 LAB — TYPE AND SCREEN
ABO/RH(D): B POS
Antibody Screen: POSITIVE
Unit division: 0
Unit division: 0
Unit division: 0
Unit division: 0
Unit division: 0

## 2023-12-15 LAB — BPAM RBC
Blood Product Expiration Date: 202510192359
Blood Product Expiration Date: 202510192359
Blood Product Expiration Date: 202510252359
Blood Product Expiration Date: 202510252359
Blood Product Expiration Date: 202510252359
ISSUE DATE / TIME: 202509262339
ISSUE DATE / TIME: 202509270559
ISSUE DATE / TIME: 202509270917
Unit Type and Rh: 7300
Unit Type and Rh: 7300
Unit Type and Rh: 7300
Unit Type and Rh: 7300
Unit Type and Rh: 7300

## 2023-12-16 ENCOUNTER — Ambulatory Visit: Payer: Self-pay | Admitting: Obstetrics & Gynecology

## 2023-12-16 LAB — GC/CHLAMYDIA PROBE AMP (~~LOC~~) NOT AT ARMC
Chlamydia: NEGATIVE
Comment: NEGATIVE
Comment: NORMAL
Neisseria Gonorrhea: NEGATIVE

## 2023-12-18 LAB — SURGICAL PATHOLOGY

## 2023-12-19 ENCOUNTER — Other Ambulatory Visit

## 2023-12-19 ENCOUNTER — Encounter: Admitting: Obstetrics and Gynecology

## 2023-12-19 DIAGNOSIS — Z3480 Encounter for supervision of other normal pregnancy, unspecified trimester: Secondary | ICD-10-CM

## 2023-12-19 DIAGNOSIS — Z98891 History of uterine scar from previous surgery: Secondary | ICD-10-CM

## 2023-12-24 ENCOUNTER — Telehealth (HOSPITAL_COMMUNITY): Payer: Self-pay

## 2023-12-24 NOTE — Telephone Encounter (Signed)
 12/24/2023 1048  Name: Nylee Barbuto MRN: [id number] DOB: 11-Sep-1995  Reason for Call:  Transition of Care Hospital Discharge Call  Contact Status: Patient Contact Status: Message  Language assistant needed:          Follow-Up Questions:    Van Postnatal Depression Scale:  In the Past 7 Days:    PHQ2-9 Depression Scale:     Discharge Follow-up:    Post-discharge interventions: NA  Signature  Rosaline Deretha PEAK

## 2024-01-09 ENCOUNTER — Encounter: Payer: Self-pay | Admitting: Nurse Practitioner

## 2024-01-09 ENCOUNTER — Other Ambulatory Visit: Payer: Self-pay

## 2024-01-09 ENCOUNTER — Ambulatory Visit: Admitting: Nurse Practitioner

## 2024-01-09 VITALS — BP 128/86 | HR 98 | Wt 321.8 lb

## 2024-01-09 DIAGNOSIS — D500 Iron deficiency anemia secondary to blood loss (chronic): Secondary | ICD-10-CM

## 2024-01-09 DIAGNOSIS — Z6841 Body Mass Index (BMI) 40.0 and over, adult: Secondary | ICD-10-CM

## 2024-01-09 DIAGNOSIS — Z9189 Other specified personal risk factors, not elsewhere classified: Secondary | ICD-10-CM

## 2024-01-09 DIAGNOSIS — Z8759 Personal history of other complications of pregnancy, childbirth and the puerperium: Secondary | ICD-10-CM

## 2024-01-09 MED ORDER — SERTRALINE HCL 25 MG PO TABS: 25.0000 mg | ORAL_TABLET | Freq: Every day | ORAL | 1 refills | Status: DC

## 2024-01-09 MED ORDER — HYDROXYZINE HCL 25 MG PO TABS: 25.0000 mg | ORAL_TABLET | Freq: Three times a day (TID) | ORAL | 2 refills | Status: AC | PRN

## 2024-01-09 NOTE — Progress Notes (Signed)
 Post Partum Visit Note  Catherine Cochran is a 28 y.o. [phone] female who presents for a postpartum visit. She is 3 weeks postpartum following a normal spontaneous vaginal delivery.  I have fully reviewed the prenatal and intrapartum course. The delivery was at [redacted]W[redacted]D gestational weeks.  Anesthesia: spinal. Postpartum course has been difficult for patient as she is still processing her loss and trying to grieve appropriately  due to fetal demise 77.2 GA with PPH due to retained POC. Not currently no bleeding. Bowel function is normal. Bladder function is normal. Patient is not sexually active. Contraception method is she desires Mirena  IUD and will plan to schedule at checkout . Postpartum depression screening:positive and she is willing to start Zoloft and see BH specialist for some grief counseling and depression   The pregnancy intention screening data noted above was reviewed. Potential methods of contraception were discussed. The patient elected to proceed with No data recorded.    Health Maintenance Due  Topic Date Due   Hepatitis B Vaccines 19-59 Average Risk (1 of 3 - 19+ 3-dose series) Never done   HPV VACCINES (1 - 3-dose SCDM series) Never done   COVID-19 Vaccine (4 - 2025-26 season) 11/18/2023    The following portions of the patient's history were reviewed and updated as appropriate: current medications, past medical history, past surgical history, and problem list.  Review of Systems Pertinent items are noted in HPI. Pertinent items noted in HPI and remainder of comprehensive ROS otherwise negative.  Objective:  LMP 07/26/2023    General:  alert, distracted, morbidly obese, and verbalized sadness    Breasts:  not indicated  Lungs: clear to auscultation bilaterally  Heart:  regular rate and rhythm  Abdomen: NT, limited exam d/t increased body habitus    Wound NA  GU exam:  normal       Assessment:   1. At risk for postpartum depression (Primary) - Verbally requested  interventions  - Ambulatory referral to [facility] - sertraline (ZOLOFT) 25 MG tablet; Take 1 tablet (25 mg total) by mouth daily.  Dispense: 30 tablet; Refill: 1 - hydrOXYzine  (ATARAX ) 25 MG tablet; Take 1 tablet (25 mg total) by mouth every 8 (eight) hours as needed for anxiety.  Dispense: 30 tablet; Refill: 2  2. H/O fetal demise, not currently pregnant  - Ambulatory referral to [facility] - sertraline (ZOLOFT) 25 MG tablet; Take 1 tablet (25 mg total) by mouth daily.  Dispense: 30 tablet; Refill: 1 - hydrOXYzine  (ATARAX ) 25 MG tablet; Take 1 tablet (25 mg total) by mouth every 8 (eight) hours as needed for anxiety.  Dispense: 30 tablet; Refill: 2  3. Iron deficiency anemia due to chronic blood loss - Iron supplements and CBC today (last Hgb 7.6 on 12/14/23) s/p 2 units PRBC for PPH prior to discharge  - hydrOXYzine  (ATARAX ) 25 MG tablet; Take 1 tablet (25 mg total) by mouth every 8 (eight) hours as needed for anxiety.  Dispense: 30 tablet; Refill: 2 - CBC  4. Morbid obesity with BMI of 50.0-59.9, adult [facility])    Plan:   Essential components of care per ACOG recommendations:  1.  Mood and well being: Patient with positive depression screening today. Reviewed local resources for support.   - hx of drug use? No.    2. Infant care and feeding: NO -Social determinants of health (SDOH) reviewed in EPIC. No concerns  3. Sexuality, contraception and birth spacing - Patient does not know want a  pregnancy in the next year.  Desired family size is unknown children.  - Reviewed reproductive life planning. Reviewed contraceptive methods based on pt preferences and effectiveness.  Patient desired IUD or IUS today.   - Discussed birth spacing of 18 months  4. Sleep and fatigue -Encouraged family/partner/community support of 4 hrs of uninterrupted sleep to help with mood and fatigue  5. Physical Recovery  - Patient has urinary incontinence? No. - Patient  is safe to resume physical and sexual activity  6.  Health Maintenance - HM due items addressed No - Patient deferred  - Last pap smear  Diagnosis  Date Value Ref Range Status  11/27/2021   Final   - Negative for intraepithelial lesion or malignancy (NILM)   Pap smear not done at today's visit.  -Breast Cancer screening indicated? No.   7. Chronic Disease/Pregnancy Condition follow up: Anemia and Obesity   - PCP follow up  Ermalinda DEEM, CMA [facility] [employer], [facility]   Olam Dalton, MSN, [facility], [facility] [employer]

## 2024-01-10 LAB — CBC
Hematocrit: 30.5 % — ABNORMAL LOW (ref 34.0–46.6)
Hemoglobin: 9.4 g/dL — ABNORMAL LOW (ref 11.1–15.9)
MCH: 26.2 pg — ABNORMAL LOW (ref 26.6–33.0)
MCHC: 30.8 g/dL — ABNORMAL LOW (ref 31.5–35.7)
MCV: 85 fL (ref 79–97)
Platelets: 512 x10E3/uL — ABNORMAL HIGH (ref 150–450)
RBC: 3.59 x10E6/uL — ABNORMAL LOW (ref 3.77–5.28)
RDW: 15.8 % — ABNORMAL HIGH (ref 11.7–15.4)
WBC: 15.8 x10E3/uL — ABNORMAL HIGH (ref 3.4–10.8)

## 2024-01-20 NOTE — BH Specialist Note (Unsigned)
 Pt did not arrive to video visit and did not answer the phone; Left HIPPA-compliant message to call back Warren from [employer] for [employer] at [facility] at  [phone] [facility] office).  ?; left MyChart message for patient.  ? ?

## 2024-01-21 ENCOUNTER — Ambulatory Visit: Admitting: Clinical

## 2024-01-21 DIAGNOSIS — Z91199 Patient's noncompliance with other medical treatment and regimen due to unspecified reason: Secondary | ICD-10-CM

## 2024-03-14 ENCOUNTER — Other Ambulatory Visit: Payer: Self-pay | Admitting: Nurse Practitioner

## 2024-03-14 DIAGNOSIS — Z9189 Other specified personal risk factors, not elsewhere classified: Secondary | ICD-10-CM

## 2024-03-14 DIAGNOSIS — Z8759 Personal history of other complications of pregnancy, childbirth and the puerperium: Secondary | ICD-10-CM

## 2024-07-27 ENCOUNTER — Encounter: Payer: Self-pay | Admitting: Nurse Practitioner
